# Patient Record
Sex: Female | Born: 1980 | Race: White | Hispanic: No | Marital: Married | Smoking: Former smoker
Health system: Southern US, Community
[De-identification: ages and names within clinical notes are randomized; demographics above are authoritative.]

## PROBLEM LIST (undated history)

## (undated) DIAGNOSIS — L639 Alopecia areata, unspecified: Secondary | ICD-10-CM

## (undated) DIAGNOSIS — K8020 Calculus of gallbladder without cholecystitis without obstruction: Secondary | ICD-10-CM

## (undated) DIAGNOSIS — K589 Irritable bowel syndrome without diarrhea: Secondary | ICD-10-CM

## (undated) DIAGNOSIS — J189 Pneumonia, unspecified organism: Secondary | ICD-10-CM

## (undated) HISTORY — PX: WISDOM TOOTH EXTRACTION: SHX21

---

## 2014-11-12 ENCOUNTER — Other Ambulatory Visit (HOSPITAL_COMMUNITY)
Admission: RE | Admit: 2014-11-12 | Discharge: 2014-11-12 | Disposition: A | Discharge status: Home / Self Care | Payer: BC Managed Care – PPO | Source: Ambulatory Visit | Attending: Family Medicine | Admitting: Family Medicine

## 2014-11-12 DIAGNOSIS — Z124 Encounter for screening for malignant neoplasm of cervix: Secondary | ICD-10-CM | POA: Insufficient documentation

## 2014-11-12 DIAGNOSIS — Z1151 Encounter for screening for human papillomavirus (HPV): Secondary | ICD-10-CM | POA: Diagnosis present

## 2016-04-12 DIAGNOSIS — L638 Other alopecia areata: Secondary | ICD-10-CM | POA: Diagnosis not present

## 2016-04-21 DIAGNOSIS — L298 Other pruritus: Secondary | ICD-10-CM | POA: Diagnosis not present

## 2016-04-21 DIAGNOSIS — B373 Candidiasis of vulva and vagina: Secondary | ICD-10-CM | POA: Diagnosis not present

## 2016-05-25 DIAGNOSIS — L638 Other alopecia areata: Secondary | ICD-10-CM | POA: Diagnosis not present

## 2016-06-10 DIAGNOSIS — E559 Vitamin D deficiency, unspecified: Secondary | ICD-10-CM | POA: Diagnosis not present

## 2016-06-17 DIAGNOSIS — L638 Other alopecia areata: Secondary | ICD-10-CM | POA: Diagnosis not present

## 2016-07-07 DIAGNOSIS — L638 Other alopecia areata: Secondary | ICD-10-CM | POA: Diagnosis not present

## 2016-08-19 DIAGNOSIS — L638 Other alopecia areata: Secondary | ICD-10-CM | POA: Diagnosis not present

## 2016-10-04 DIAGNOSIS — L638 Other alopecia areata: Secondary | ICD-10-CM | POA: Diagnosis not present

## 2016-10-04 DIAGNOSIS — Z79899 Other long term (current) drug therapy: Secondary | ICD-10-CM | POA: Diagnosis not present

## 2016-10-11 DIAGNOSIS — E7800 Pure hypercholesterolemia, unspecified: Secondary | ICD-10-CM | POA: Diagnosis not present

## 2016-10-11 DIAGNOSIS — E559 Vitamin D deficiency, unspecified: Secondary | ICD-10-CM | POA: Diagnosis not present

## 2016-10-11 DIAGNOSIS — Z0001 Encounter for general adult medical examination with abnormal findings: Secondary | ICD-10-CM | POA: Diagnosis not present

## 2016-10-20 DIAGNOSIS — Z79899 Other long term (current) drug therapy: Secondary | ICD-10-CM | POA: Diagnosis not present

## 2016-10-20 DIAGNOSIS — L638 Other alopecia areata: Secondary | ICD-10-CM | POA: Diagnosis not present

## 2016-11-16 DIAGNOSIS — L638 Other alopecia areata: Secondary | ICD-10-CM | POA: Diagnosis not present

## 2016-11-16 DIAGNOSIS — Z79899 Other long term (current) drug therapy: Secondary | ICD-10-CM | POA: Diagnosis not present

## 2016-12-23 DIAGNOSIS — Z79899 Other long term (current) drug therapy: Secondary | ICD-10-CM | POA: Diagnosis not present

## 2016-12-23 DIAGNOSIS — L638 Other alopecia areata: Secondary | ICD-10-CM | POA: Diagnosis not present

## 2017-01-25 DIAGNOSIS — L638 Other alopecia areata: Secondary | ICD-10-CM | POA: Diagnosis not present

## 2017-01-25 DIAGNOSIS — D692 Other nonthrombocytopenic purpura: Secondary | ICD-10-CM | POA: Diagnosis not present

## 2017-03-28 DIAGNOSIS — L638 Other alopecia areata: Secondary | ICD-10-CM | POA: Diagnosis not present

## 2017-03-28 DIAGNOSIS — Z79899 Other long term (current) drug therapy: Secondary | ICD-10-CM | POA: Diagnosis not present

## 2017-04-11 DIAGNOSIS — H5213 Myopia, bilateral: Secondary | ICD-10-CM | POA: Diagnosis not present

## 2017-04-11 DIAGNOSIS — Z79899 Other long term (current) drug therapy: Secondary | ICD-10-CM | POA: Diagnosis not present

## 2017-05-02 DIAGNOSIS — Z79899 Other long term (current) drug therapy: Secondary | ICD-10-CM | POA: Diagnosis not present

## 2017-06-27 DIAGNOSIS — L638 Other alopecia areata: Secondary | ICD-10-CM | POA: Diagnosis not present

## 2017-06-27 DIAGNOSIS — Z79899 Other long term (current) drug therapy: Secondary | ICD-10-CM | POA: Diagnosis not present

## 2017-06-27 DIAGNOSIS — L811 Chloasma: Secondary | ICD-10-CM | POA: Diagnosis not present

## 2017-08-14 DIAGNOSIS — N760 Acute vaginitis: Secondary | ICD-10-CM | POA: Diagnosis not present

## 2017-10-18 ENCOUNTER — Other Ambulatory Visit (HOSPITAL_COMMUNITY)
Admission: RE | Admit: 2017-10-18 | Discharge: 2017-10-18 | Disposition: A | Discharge status: Home / Self Care | Payer: BLUE CROSS/BLUE SHIELD | Source: Ambulatory Visit | Attending: Family Medicine | Admitting: Family Medicine

## 2017-10-18 ENCOUNTER — Other Ambulatory Visit: Payer: Self-pay | Admitting: Family Medicine

## 2017-10-18 DIAGNOSIS — Z0000 Encounter for general adult medical examination without abnormal findings: Secondary | ICD-10-CM | POA: Diagnosis not present

## 2017-10-18 DIAGNOSIS — Z124 Encounter for screening for malignant neoplasm of cervix: Secondary | ICD-10-CM | POA: Insufficient documentation

## 2017-10-18 DIAGNOSIS — L630 Alopecia (capitis) totalis: Secondary | ICD-10-CM | POA: Diagnosis not present

## 2017-10-19 LAB — CYTOLOGY - PAP
Diagnosis: NEGATIVE
HPV: NOT DETECTED

## 2017-11-03 DIAGNOSIS — L638 Other alopecia areata: Secondary | ICD-10-CM | POA: Diagnosis not present

## 2017-11-03 DIAGNOSIS — Z79899 Other long term (current) drug therapy: Secondary | ICD-10-CM | POA: Diagnosis not present

## 2018-03-02 DIAGNOSIS — Z79899 Other long term (current) drug therapy: Secondary | ICD-10-CM | POA: Diagnosis not present

## 2018-03-02 DIAGNOSIS — L638 Other alopecia areata: Secondary | ICD-10-CM | POA: Diagnosis not present

## 2018-04-20 DIAGNOSIS — Z79899 Other long term (current) drug therapy: Secondary | ICD-10-CM | POA: Diagnosis not present

## 2018-04-20 DIAGNOSIS — H5213 Myopia, bilateral: Secondary | ICD-10-CM | POA: Diagnosis not present

## 2018-07-06 DIAGNOSIS — Z79899 Other long term (current) drug therapy: Secondary | ICD-10-CM | POA: Diagnosis not present

## 2018-07-06 DIAGNOSIS — L638 Other alopecia areata: Secondary | ICD-10-CM | POA: Diagnosis not present

## 2018-10-07 DIAGNOSIS — N760 Acute vaginitis: Secondary | ICD-10-CM | POA: Diagnosis not present

## 2018-10-21 DIAGNOSIS — B373 Candidiasis of vulva and vagina: Secondary | ICD-10-CM | POA: Diagnosis not present

## 2018-11-07 DIAGNOSIS — Z131 Encounter for screening for diabetes mellitus: Secondary | ICD-10-CM | POA: Diagnosis not present

## 2018-11-07 DIAGNOSIS — Z0000 Encounter for general adult medical examination without abnormal findings: Secondary | ICD-10-CM | POA: Diagnosis not present

## 2018-11-07 DIAGNOSIS — Z13220 Encounter for screening for lipoid disorders: Secondary | ICD-10-CM | POA: Diagnosis not present

## 2018-12-13 DIAGNOSIS — Z79899 Other long term (current) drug therapy: Secondary | ICD-10-CM | POA: Diagnosis not present

## 2018-12-13 DIAGNOSIS — L638 Other alopecia areata: Secondary | ICD-10-CM | POA: Diagnosis not present

## 2019-03-07 DIAGNOSIS — L638 Other alopecia areata: Secondary | ICD-10-CM | POA: Diagnosis not present

## 2019-07-15 DIAGNOSIS — N898 Other specified noninflammatory disorders of vagina: Secondary | ICD-10-CM | POA: Diagnosis not present

## 2019-11-03 DIAGNOSIS — R1011 Right upper quadrant pain: Secondary | ICD-10-CM | POA: Diagnosis not present

## 2019-11-03 DIAGNOSIS — Z79899 Other long term (current) drug therapy: Secondary | ICD-10-CM | POA: Diagnosis not present

## 2019-11-03 DIAGNOSIS — F17200 Nicotine dependence, unspecified, uncomplicated: Secondary | ICD-10-CM | POA: Diagnosis not present

## 2019-11-06 DIAGNOSIS — R1011 Right upper quadrant pain: Secondary | ICD-10-CM | POA: Diagnosis not present

## 2019-11-06 DIAGNOSIS — Z23 Encounter for immunization: Secondary | ICD-10-CM | POA: Diagnosis not present

## 2019-11-09 ENCOUNTER — Other Ambulatory Visit: Payer: Self-pay | Admitting: Family Medicine

## 2019-11-09 DIAGNOSIS — R1011 Right upper quadrant pain: Secondary | ICD-10-CM

## 2019-11-13 ENCOUNTER — Ambulatory Visit
Admission: RE | Admit: 2019-11-13 | Discharge: 2019-11-13 | Disposition: A | Discharge status: Home / Self Care | Payer: BC Managed Care – PPO | Source: Ambulatory Visit | Attending: Family Medicine | Admitting: Family Medicine

## 2019-11-13 DIAGNOSIS — K8020 Calculus of gallbladder without cholecystitis without obstruction: Secondary | ICD-10-CM | POA: Diagnosis not present

## 2019-11-13 DIAGNOSIS — R1011 Right upper quadrant pain: Secondary | ICD-10-CM

## 2019-11-16 ENCOUNTER — Ambulatory Visit: Payer: Self-pay | Admitting: Surgery

## 2019-11-16 DIAGNOSIS — K8010 Calculus of gallbladder with chronic cholecystitis without obstruction: Secondary | ICD-10-CM | POA: Diagnosis not present

## 2019-11-16 NOTE — H&P (Signed)
History of Present Illness Wilmon Arms. Brailey Buescher MD; 11/16/2019 12:09 PM) The patient is a 38 year old female who presents for evaluation of gall stones. CLINICAL DATA: Right upper quadrant pain Referred By Dr. Beverley Fiedler for gallstones  This is a 38 year old female with a history of IBS who presents with a three-week history of postprandial upper abdominal pain radiating through to her back. This is associate with nausea and vomiting. Her symptoms have become more severe where she has symptoms almost every time she tries to eat. She has been existing on crackers and sliced [place] and hot tea for the last several days. She denies any significant diarrhea or abdominal bloating. This morning she had a banana and has been asymptomatic. She is referred to [place] for evaluation after recent ultrasound. She did have a visit to an outside ER early in November that showed normal liver function tests and a normal white blood cell count.   EXAM: ULTRASOUND ABDOMEN LIMITED RIGHT UPPER QUADRANT  COMPARISON: None.  FINDINGS: Gallbladder:  Non mobile shadowing stone at the gallbladder neck. Normal wall thickness. Positive sonographic Murphy. Additional nonshadowing focus at the gallbladder fundus either representing tumefactive sludge or nonshadowing stone. Focus of comet tail artifact arising from the anterior gallbladder wall.  Common bile duct:  Diameter: 2 mm  Liver:  No focal lesion identified. Within normal limits in parenchymal echogenicity. Portal vein is patent on color Doppler imaging with normal direction of blood flow towards the liver.  Other: None.  IMPRESSION: 1. Non mobile stone at the gallbladder neck with positive sonographic Eulah Pont, raises some concern for an acute cholecystitis. Wall thickness is normal and there is no pericholecystic fluid. Consider correlation with nuclear medicine hepatobiliary imaging 2. No biliary dilatation 3. 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   Electronically Signed By: Jasmine Pang M.D. On: 11/13/2019 15:18   Past Surgical History Santiago Glad, [place]; 11/16/2019 11:24 AM) Oral Surgery  Diagnostic Studies History Santiago Glad, [place]; 11/16/2019 11:24 AM) Colonoscopy never Mammogram never Pap Smear 1-5 years ago  Allergies Santiago Glad, CMA; 11/16/2019 11:26 AM) No Known Drug Allergies [11/16/2019]: Allergies Reconciled  Medication History Santiago Glad, CMA; 11/16/2019 11:26 AM) Ezetimibe-Simvastatin (10-40MG  Tablet, Oral) Active. traMADol HCl (50MG  Tablet, Oral) Active. Hydroxychloroquine Sulfate (200MG  Tablet, Oral) Active. Medications Reconciled  Social History , ; 11/16/2019 11:24 AM) Alcohol use Occasional alcohol use. Caffeine use Coffee, Tea. No drug use Tobacco use Current every day smoker.  Family History [place], 11/18/2019; 11/16/2019 11:24 AM) Arthritis Family Members In General. Migraine Headache Father. Thyroid problems Mother.  Pregnancy / Birth History [place], 11/18/2019; 11/16/2019 11:24 AM) Age at menarche 12 years. Contraceptive History Depo-provera, Oral contraceptives. Gravida 0 Para 0 Regular periods  Other Problems [place], CMA; 11/16/2019 11:24 AM) Cholelithiasis Gastroesophageal Reflux Disease Other disease, cancer, significant illness     Review of Systems Santiago Glad CMA; 11/16/2019 11:24 AM) General Present- Weight Loss. Not Present- Appetite Loss, Chills, Fatigue, Fever, Night Sweats and Weight Gain. Skin Not Present- Change in Wart/Mole, Dryness, Hives, Jaundice, New Lesions, Non-Healing Wounds, Rash and Ulcer. HEENT Present- Wears glasses/contact lenses. Not Present- Earache, Hearing Loss, Hoarseness, Nose  Bleed, Oral Ulcers, Ringing in the Ears, Seasonal Allergies, Sinus Pain, Sore Throat, Visual Disturbances and Yellow Eyes. Respiratory Not Present- Bloody sputum, Chronic Cough, Difficulty Breathing, Snoring and Wheezing. Breast Not Present- Breast Mass,  Breast Pain, Nipple Discharge and Skin Changes. Cardiovascular Present- Chest Pain. Not Present- Difficulty Breathing Lying Down, Leg Cramps, Palpitations, Rapid Heart Rate, Shortness of Breath and Swelling of Extremities. Gastrointestinal Present- Abdominal Pain, Bloating, Change in Bowel Habits, Indigestion and Nausea. Not Present- Bloody Stool, Chronic diarrhea, Constipation, Difficulty Swallowing, Excessive gas, Gets full quickly at meals, Hemorrhoids, Rectal Pain and Vomiting. Female Genitourinary Not Present- Frequency, Nocturia, Painful Urination, Pelvic Pain and Urgency. Musculoskeletal Not Present- Back Pain, Joint Pain, Joint Stiffness, Muscle Pain, Muscle Weakness and Swelling of Extremities. Neurological Not Present- Decreased Memory, Fainting, Headaches, Numbness, Seizures, Tingling, Tremor, Trouble walking and Weakness. Psychiatric Present- Anxiety. Not Present- Bipolar, Change in Sleep Pattern, Depression, Fearful and Frequent crying. Endocrine Not Present- Cold Intolerance, Excessive Hunger, Hair Changes, Heat Intolerance, Hot flashes and New Diabetes. Hematology Not Present- Blood Thinners, Easy Bruising, Excessive bleeding, Gland problems, HIV and Persistent Infections.  Vitals Emeline Gins CMA; 11/16/2019 11:25 AM) 11/16/2019 11:24 AM Weight: 184.8 lb Height: 66in Body Surface Area: 1.93 m Body Mass Index: 29.83 kg/m  Temp.: 98.39F  Pulse: 86 (Regular)  BP: 100/68 (Sitting, Left Arm, Standard)        Physical Exam Rodman Key K. Dacotah Cabello MD; 11/16/2019 12:10 PM)  The physical exam findings are as follows: Note:WDWN in NAD Eyes: Pupils equal, round; sclera anicteric HENT: Oral mucosa moist; good  dentition Neck: No masses palpated, no thyromegaly Lungs: CTA bilaterally; normal respiratory effort CV: Regular rate and rhythm; no murmurs; extremities well-perfused with no edema Abd: +bowel sounds, soft, mildly tender in RUQ, no palpable organomegaly; no palpable hernias Skin: Warm, dry; no sign of jaundice Psychiatric - alert and oriented x 4; calm mood and affect    Assessment & Plan Rodman Key K. Tahnee Cifuentes MD; 11/16/2019 12:03 PM)  CHRONIC CHOLECYSTITIS WITH CALCULUS (K80.10)  Current Plans Schedule for Surgery - Laparoscopic cholecystectomy with intraoperative cholangiogram. The surgical procedure has been discussed with the patient. Potential risks, benefits, alternative treatments, and expected outcomes have been explained. All of the patient's questions at this time have been answered. The likelihood of reaching the patient's treatment goal is good. The patient understand the proposed surgical procedure and wishes to proceed. Started oxyCODONE HCl 5 MG Oral Tablet, 1 (one) Tablet every four hours, as needed, #20, 11/16/2019, No Refill.  Imogene Burn. Georgette Dover, MD, [facility]  General/ Trauma Surgery   11/16/2019 12:10 PM

## 2019-11-16 NOTE — H&P (View-Only) (Signed)
History of Present Illness Wilmon Arms. Tavaras Goody MD; 11/16/2019 12:09 PM) The patient is a 38 year old female who presents for evaluation of gall stones. CLINICAL DATA: Right upper quadrant pain Referred By Dr. Beverley Fiedler for gallstones  This is a 38 year old female with a history of IBS who presents with a three-week history of postprandial upper abdominal pain radiating through to her back. This is associate with nausea and vomiting. Her symptoms have become more severe where she has symptoms almost every time she tries to eat. She has been existing on crackers and sliced [place] and hot tea for the last several days. She denies any significant diarrhea or abdominal bloating. This morning she had a banana and has been asymptomatic. She is referred to [place] for evaluation after recent ultrasound. She did have a visit to an outside ER early in November that showed normal liver function tests and a normal white blood cell count.   EXAM: ULTRASOUND ABDOMEN LIMITED RIGHT UPPER QUADRANT  COMPARISON: None.  FINDINGS: Gallbladder:  Non mobile shadowing stone at the gallbladder neck. Normal wall thickness. Positive sonographic Murphy. Additional nonshadowing focus at the gallbladder fundus either representing tumefactive sludge or nonshadowing stone. Focus of comet tail artifact arising from the anterior gallbladder wall.  Common bile duct:  Diameter: 2 mm  Liver:  No focal lesion identified. Within normal limits in parenchymal echogenicity. Portal vein is patent on color Doppler imaging with normal direction of blood flow towards the liver.  Other: None.  IMPRESSION: 1. Non mobile stone at the gallbladder neck with positive sonographic Eulah Pont, raises some concern for an acute cholecystitis. Wall thickness is normal and there is no pericholecystic fluid. Consider correlation with nuclear medicine hepatobiliary imaging 2. No biliary dilatation 3. 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   Electronically Signed By: Jasmine Pang M.D. On: 11/13/2019 15:18   Past Surgical History Santiago Glad, [place]; 11/16/2019 11:24 AM) Oral Surgery  Diagnostic Studies History Santiago Glad, [place]; 11/16/2019 11:24 AM) Colonoscopy never Mammogram never Pap Smear 1-5 years ago  Allergies Santiago Glad, CMA; 11/16/2019 11:26 AM) No Known Drug Allergies [11/16/2019]: Allergies Reconciled  Medication History Santiago Glad, CMA; 11/16/2019 11:26 AM) Ezetimibe-Simvastatin (10-40MG  Tablet, Oral) Active. traMADol HCl (50MG  Tablet, Oral) Active. Hydroxychloroquine Sulfate (200MG  Tablet, Oral) Active. Medications Reconciled  Social History , ; 11/16/2019 11:24 AM) Alcohol use Occasional alcohol use. Caffeine use Coffee, Tea. No drug use Tobacco use Current every day smoker.  Family History [place], 11/18/2019; 11/16/2019 11:24 AM) Arthritis Family Members In General. Migraine Headache Father. Thyroid problems Mother.  Pregnancy / Birth History [place], 11/18/2019; 11/16/2019 11:24 AM) Age at menarche 12 years. Contraceptive History Depo-provera, Oral contraceptives. Gravida 0 Para 0 Regular periods  Other Problems [place], CMA; 11/16/2019 11:24 AM) Cholelithiasis Gastroesophageal Reflux Disease Other disease, cancer, significant illness     Review of Systems Santiago Glad CMA; 11/16/2019 11:24 AM) General Present- Weight Loss. Not Present- Appetite Loss, Chills, Fatigue, Fever, Night Sweats and Weight Gain. Skin Not Present- Change in Wart/Mole, Dryness, Hives, Jaundice, New Lesions, Non-Healing Wounds, Rash and Ulcer. HEENT Present- Wears glasses/contact lenses. Not Present- Earache, Hearing Loss, Hoarseness, Nose  Bleed, Oral Ulcers, Ringing in the Ears, Seasonal Allergies, Sinus Pain, Sore Throat, Visual Disturbances and Yellow Eyes. Respiratory Not Present- Bloody sputum, Chronic Cough, Difficulty Breathing, Snoring and Wheezing. Breast Not Present- Breast Mass,  Breast Pain, Nipple Discharge and Skin Changes. Cardiovascular Present- Chest Pain. Not Present- Difficulty Breathing Lying Down, Leg Cramps, Palpitations, Rapid Heart Rate, Shortness of Breath and Swelling of Extremities. Gastrointestinal Present- Abdominal Pain, Bloating, Change in Bowel Habits, Indigestion and Nausea. Not Present- Bloody Stool, Chronic diarrhea, Constipation, Difficulty Swallowing, Excessive gas, Gets full quickly at meals, Hemorrhoids, Rectal Pain and Vomiting. Female Genitourinary Not Present- Frequency, Nocturia, Painful Urination, Pelvic Pain and Urgency. Musculoskeletal Not Present- Back Pain, Joint Pain, Joint Stiffness, Muscle Pain, Muscle Weakness and Swelling of Extremities. Neurological Not Present- Decreased Memory, Fainting, Headaches, Numbness, Seizures, Tingling, Tremor, Trouble walking and Weakness. Psychiatric Present- Anxiety. Not Present- Bipolar, Change in Sleep Pattern, Depression, Fearful and Frequent crying. Endocrine Not Present- Cold Intolerance, Excessive Hunger, Hair Changes, Heat Intolerance, Hot flashes and New Diabetes. Hematology Not Present- Blood Thinners, Easy Bruising, Excessive bleeding, Gland problems, HIV and Persistent Infections.  Vitals Emeline Gins CMA; 11/16/2019 11:25 AM) 11/16/2019 11:24 AM Weight: 184.8 lb Height: 66in Body Surface Area: 1.93 m Body Mass Index: 29.83 kg/m  Temp.: 98.39F  Pulse: 86 (Regular)  BP: 100/68 (Sitting, Left Arm, Standard)        Physical Exam Rodman Key K. Orlondo Holycross MD; 11/16/2019 12:10 PM)  The physical exam findings are as follows: Note:WDWN in NAD Eyes: Pupils equal, round; sclera anicteric HENT: Oral mucosa moist; good  dentition Neck: No masses palpated, no thyromegaly Lungs: CTA bilaterally; normal respiratory effort CV: Regular rate and rhythm; no murmurs; extremities well-perfused with no edema Abd: +bowel sounds, soft, mildly tender in RUQ, no palpable organomegaly; no palpable hernias Skin: Warm, dry; no sign of jaundice Psychiatric - alert and oriented x 4; calm mood and affect    Assessment & Plan Rodman Key K. Chriss Mannan MD; 11/16/2019 12:03 PM)  CHRONIC CHOLECYSTITIS WITH CALCULUS (K80.10)  Current Plans Schedule for Surgery - Laparoscopic cholecystectomy with intraoperative cholangiogram. The surgical procedure has been discussed with the patient. Potential risks, benefits, alternative treatments, and expected outcomes have been explained. All of the patient's questions at this time have been answered. The likelihood of reaching the patient's treatment goal is good. The patient understand the proposed surgical procedure and wishes to proceed. Started oxyCODONE HCl 5 MG Oral Tablet, 1 (one) Tablet every four hours, as needed, #20, 11/16/2019, No Refill.  Imogene Burn. Georgette Dover, MD, [facility]  General/ Trauma Surgery   11/16/2019 12:10 PM

## 2019-11-19 ENCOUNTER — Other Ambulatory Visit: Payer: Self-pay

## 2019-11-19 ENCOUNTER — Encounter (HOSPITAL_COMMUNITY): Payer: Self-pay | Admitting: *Deleted

## 2019-11-19 NOTE — Progress Notes (Signed)
Spoke with pt for pre-op call. Pt denies cardiac history, HTN or Diabetes.   Pt will get her Covid test done tomorrow, 11/20/19. Quarantine instructions given to pt and she voiced understanding.

## 2019-11-20 ENCOUNTER — Other Ambulatory Visit (HOSPITAL_COMMUNITY)
Admission: RE | Admit: 2019-11-20 | Discharge: 2019-11-20 | Disposition: A | Discharge status: Home / Self Care | Payer: BC Managed Care – PPO | Source: Ambulatory Visit | Attending: Surgery | Admitting: Surgery

## 2019-11-20 DIAGNOSIS — Z20828 Contact with and (suspected) exposure to other viral communicable diseases: Secondary | ICD-10-CM | POA: Diagnosis not present

## 2019-11-20 DIAGNOSIS — Z01812 Encounter for preprocedural laboratory examination: Secondary | ICD-10-CM | POA: Diagnosis not present

## 2019-11-20 LAB — SARS CORONAVIRUS 2 (TAT 6-24 HRS): SARS Coronavirus 2: NEGATIVE

## 2019-11-23 ENCOUNTER — Other Ambulatory Visit: Payer: Self-pay

## 2019-11-23 ENCOUNTER — Ambulatory Visit (HOSPITAL_COMMUNITY)
Admission: RE | Admit: 2019-11-23 | Discharge: 2019-11-23 | Disposition: A | Discharge status: Home / Self Care | Payer: BC Managed Care – PPO | Attending: Surgery | Admitting: Surgery

## 2019-11-23 ENCOUNTER — Ambulatory Visit (HOSPITAL_COMMUNITY): Payer: BC Managed Care – PPO | Admitting: Anesthesiology

## 2019-11-23 ENCOUNTER — Ambulatory Visit (HOSPITAL_COMMUNITY): Payer: BC Managed Care – PPO

## 2019-11-23 ENCOUNTER — Encounter (HOSPITAL_COMMUNITY)
Admission: RE | Disposition: A | Discharge status: Home / Self Care | Payer: Self-pay | Source: Home / Self Care | Attending: Surgery

## 2019-11-23 ENCOUNTER — Encounter (HOSPITAL_COMMUNITY): Payer: Self-pay

## 2019-11-23 DIAGNOSIS — L639 Alopecia areata, unspecified: Secondary | ICD-10-CM | POA: Diagnosis not present

## 2019-11-23 DIAGNOSIS — Z419 Encounter for procedure for purposes other than remedying health state, unspecified: Secondary | ICD-10-CM

## 2019-11-23 DIAGNOSIS — Z79891 Long term (current) use of opiate analgesic: Secondary | ICD-10-CM | POA: Diagnosis not present

## 2019-11-23 DIAGNOSIS — Z79899 Other long term (current) drug therapy: Secondary | ICD-10-CM | POA: Insufficient documentation

## 2019-11-23 DIAGNOSIS — K8012 Calculus of gallbladder with acute and chronic cholecystitis without obstruction: Secondary | ICD-10-CM | POA: Insufficient documentation

## 2019-11-23 DIAGNOSIS — K8010 Calculus of gallbladder with chronic cholecystitis without obstruction: Secondary | ICD-10-CM | POA: Diagnosis not present

## 2019-11-23 DIAGNOSIS — F17200 Nicotine dependence, unspecified, uncomplicated: Secondary | ICD-10-CM | POA: Diagnosis not present

## 2019-11-23 DIAGNOSIS — J189 Pneumonia, unspecified organism: Secondary | ICD-10-CM | POA: Diagnosis not present

## 2019-11-23 DIAGNOSIS — K8000 Calculus of gallbladder with acute cholecystitis without obstruction: Secondary | ICD-10-CM | POA: Diagnosis not present

## 2019-11-23 DIAGNOSIS — K8020 Calculus of gallbladder without cholecystitis without obstruction: Secondary | ICD-10-CM | POA: Diagnosis not present

## 2019-11-23 HISTORY — DX: Pneumonia, unspecified organism: J18.9

## 2019-11-23 HISTORY — DX: Irritable bowel syndrome without diarrhea: K58.9

## 2019-11-23 HISTORY — PX: CHOLECYSTECTOMY: SHX55

## 2019-11-23 HISTORY — DX: Alopecia areata, unspecified: L63.9

## 2019-11-23 LAB — CBC
HCT: 39.8 % (ref 36.0–46.0)
Hemoglobin: 13.8 g/dL (ref 12.0–15.0)
MCH: 32.1 pg (ref 26.0–34.0)
MCHC: 34.7 g/dL (ref 30.0–36.0)
MCV: 92.6 fL (ref 80.0–100.0)
Platelets: 245 10*3/uL (ref 150–400)
RBC: 4.3 MIL/uL (ref 3.87–5.11)
RDW: 12 % (ref 11.5–15.5)
WBC: 9.4 10*3/uL (ref 4.0–10.5)
nRBC: 0 % (ref 0.0–0.2)

## 2019-11-23 LAB — POCT PREGNANCY, URINE: Preg Test, Ur: NEGATIVE

## 2019-11-23 SURGERY — LAPAROSCOPIC CHOLECYSTECTOMY WITH INTRAOPERATIVE CHOLANGIOGRAM
Anesthesia: General

## 2019-11-23 MED ORDER — ACETAMINOPHEN 10 MG/ML IV SOLN: 1000.0000 mg | Freq: Once | INTRAVENOUS | Status: DC | PRN

## 2019-11-23 MED ORDER — SODIUM CHLORIDE 0.9 % IV SOLN: INTRAVENOUS | Status: DC | PRN

## 2019-11-23 MED ORDER — SUGAMMADEX SODIUM 200 MG/2ML IV SOLN
INTRAVENOUS | Status: DC | PRN
  Administered 2019-11-23: 200 mg via INTRAVENOUS

## 2019-11-23 MED ORDER — ACETAMINOPHEN 500 MG PO TABS
1000.0000 mg | ORAL_TABLET | ORAL | Status: AC
  Administered 2019-11-23: 1000 mg via ORAL
  Filled 2019-11-23: qty 2

## 2019-11-23 MED ORDER — ONDANSETRON HCL 4 MG/2ML IJ SOLN
INTRAMUSCULAR | Status: DC | PRN
  Administered 2019-11-23: 4 mg via INTRAVENOUS

## 2019-11-23 MED ORDER — FENTANYL CITRATE (PF) 100 MCG/2ML IJ SOLN: 25.0000 ug | INTRAMUSCULAR | Status: DC | PRN

## 2019-11-23 MED ORDER — ROCURONIUM BROMIDE 10 MG/ML (PF) SYRINGE
PREFILLED_SYRINGE | INTRAVENOUS | Status: AC
  Filled 2019-11-23: qty 10

## 2019-11-23 MED ORDER — STERILE WATER FOR IRRIGATION IR SOLN
Status: DC | PRN
  Administered 2019-11-23: 1000 mL

## 2019-11-23 MED ORDER — 0.9 % SODIUM CHLORIDE (POUR BTL) OPTIME
TOPICAL | Status: DC | PRN
  Administered 2019-11-23: 1000 mL

## 2019-11-23 MED ORDER — LACTATED RINGERS IV SOLN: INTRAVENOUS | Status: DC

## 2019-11-23 MED ORDER — LIDOCAINE 2% (20 MG/ML) 5 ML SYRINGE
INTRAMUSCULAR | Status: DC | PRN
  Administered 2019-11-23: 60 mg via INTRAVENOUS

## 2019-11-23 MED ORDER — SODIUM CHLORIDE 0.9 % IV SOLN
INTRAVENOUS | Status: DC | PRN
  Administered 2019-11-23: 8 mL

## 2019-11-23 MED ORDER — ACETAMINOPHEN 325 MG PO TABS: 325.0000 mg | ORAL_TABLET | Freq: Once | ORAL | Status: DC | PRN

## 2019-11-23 MED ORDER — SODIUM CHLORIDE 0.9 % IR SOLN
Status: DC | PRN
  Administered 2019-11-23: 1000 mL

## 2019-11-23 MED ORDER — FENTANYL CITRATE (PF) 250 MCG/5ML IJ SOLN
INTRAMUSCULAR | Status: DC | PRN
  Administered 2019-11-23 (×2): 50 ug via INTRAVENOUS
  Administered 2019-11-23: 100 ug via INTRAVENOUS
  Administered 2019-11-23 (×4): 50 ug via INTRAVENOUS

## 2019-11-23 MED ORDER — ONDANSETRON HCL 4 MG/2ML IJ SOLN
INTRAMUSCULAR | Status: AC
  Filled 2019-11-23: qty 2

## 2019-11-23 MED ORDER — OXYCODONE HCL 5 MG PO TABS: 5.0000 mg | ORAL_TABLET | Freq: Four times a day (QID) | ORAL | 0 refills | Status: DC | PRN

## 2019-11-23 MED ORDER — DEXAMETHASONE SODIUM PHOSPHATE 10 MG/ML IJ SOLN
INTRAMUSCULAR | Status: DC | PRN
  Administered 2019-11-23: 5 mg via INTRAVENOUS

## 2019-11-23 MED ORDER — ACETAMINOPHEN 160 MG/5ML PO SOLN: 325.0000 mg | Freq: Once | ORAL | Status: DC | PRN

## 2019-11-23 MED ORDER — KETOROLAC TROMETHAMINE 30 MG/ML IJ SOLN
INTRAMUSCULAR | Status: DC | PRN
  Administered 2019-11-23: 30 mg via INTRAVENOUS

## 2019-11-23 MED ORDER — FENTANYL CITRATE (PF) 250 MCG/5ML IJ SOLN
INTRAMUSCULAR | Status: AC
  Filled 2019-11-23: qty 5

## 2019-11-23 MED ORDER — LIDOCAINE 2% (20 MG/ML) 5 ML SYRINGE
INTRAMUSCULAR | Status: AC
  Filled 2019-11-23: qty 5

## 2019-11-23 MED ORDER — CEFAZOLIN SODIUM-DEXTROSE 2-4 GM/100ML-% IV SOLN
2.0000 g | INTRAVENOUS | Status: AC
  Administered 2019-11-23: 2 g via INTRAVENOUS
  Filled 2019-11-23: qty 100

## 2019-11-23 MED ORDER — PROPOFOL 10 MG/ML IV BOLUS
INTRAVENOUS | Status: AC
  Filled 2019-11-23: qty 20

## 2019-11-23 MED ORDER — BUPIVACAINE-EPINEPHRINE (PF) 0.5% -1:200000 IJ SOLN
INTRAMUSCULAR | Status: DC | PRN
  Administered 2019-11-23: 14 mL via PERINEURAL

## 2019-11-23 MED ORDER — PROMETHAZINE HCL 25 MG/ML IJ SOLN: 6.2500 mg | INTRAMUSCULAR | Status: DC | PRN

## 2019-11-23 MED ORDER — ROCURONIUM BROMIDE 10 MG/ML (PF) SYRINGE
PREFILLED_SYRINGE | INTRAVENOUS | Status: DC | PRN
  Administered 2019-11-23: 60 mg via INTRAVENOUS
  Administered 2019-11-23: 10 mg via INTRAVENOUS

## 2019-11-23 MED ORDER — CHLORHEXIDINE GLUCONATE CLOTH 2 % EX PADS: 6.0000 | MEDICATED_PAD | Freq: Once | CUTANEOUS | Status: DC

## 2019-11-23 MED ORDER — BUPIVACAINE HCL (PF) 0.25 % IJ SOLN
INTRAMUSCULAR | Status: AC
  Filled 2019-11-23: qty 30

## 2019-11-23 MED ORDER — MEPERIDINE HCL 25 MG/ML IJ SOLN: 6.2500 mg | INTRAMUSCULAR | Status: DC | PRN

## 2019-11-23 MED ORDER — GABAPENTIN 300 MG PO CAPS
300.0000 mg | ORAL_CAPSULE | ORAL | Status: AC
  Administered 2019-11-23: 300 mg via ORAL
  Filled 2019-11-23: qty 1

## 2019-11-23 MED ORDER — LACTATED RINGERS IV SOLN
INTRAVENOUS | Status: DC | PRN
  Administered 2019-11-23 (×2): via INTRAVENOUS

## 2019-11-23 MED ORDER — KETOROLAC TROMETHAMINE 30 MG/ML IJ SOLN
INTRAMUSCULAR | Status: AC
  Filled 2019-11-23: qty 1

## 2019-11-23 MED ORDER — BUPIVACAINE-EPINEPHRINE 0.5% -1:200000 IJ SOLN
INTRAMUSCULAR | Status: AC
  Filled 2019-11-23: qty 1

## 2019-11-23 MED ORDER — FENTANYL CITRATE (PF) 100 MCG/2ML IJ SOLN
INTRAMUSCULAR | Status: AC
  Filled 2019-11-23: qty 2

## 2019-11-23 MED ORDER — PROPOFOL 10 MG/ML IV BOLUS
INTRAVENOUS | Status: DC | PRN
  Administered 2019-11-23: 140 mg via INTRAVENOUS

## 2019-11-23 MED ORDER — DEXAMETHASONE SODIUM PHOSPHATE 10 MG/ML IJ SOLN
INTRAMUSCULAR | Status: AC
  Filled 2019-11-23: qty 1

## 2019-11-23 MED ORDER — MIDAZOLAM HCL 2 MG/2ML IJ SOLN
INTRAMUSCULAR | Status: AC
  Filled 2019-11-23: qty 2

## 2019-11-23 SURGICAL SUPPLY — 45 items
APPLIER CLIP ROT 10 11.4 M/L (STAPLE) ×2
BENZOIN TINCTURE PRP APPL 2/3 (GAUZE/BANDAGES/DRESSINGS) ×2 IMPLANT
BLADE CLIPPER SURG (BLADE) IMPLANT
CANISTER SUCT 3000ML PPV (MISCELLANEOUS) ×2 IMPLANT
CHLORAPREP W/TINT 26 (MISCELLANEOUS) ×2 IMPLANT
CLIP APPLIE ROT 10 11.4 M/L (STAPLE) ×1 IMPLANT
CLSR STERI-STRIP ANTIMIC 1/2X4 (GAUZE/BANDAGES/DRESSINGS) ×2 IMPLANT
COVER MAYO STAND STRL (DRAPES) ×2 IMPLANT
COVER SURGICAL LIGHT HANDLE (MISCELLANEOUS) ×2 IMPLANT
COVER WAND RF STERILE (DRAPES) IMPLANT
DRAPE C-ARM 42X120 X-RAY (DRAPES) ×2 IMPLANT
DRSG TEGADERM 2-3/8X2-3/4 SM (GAUZE/BANDAGES/DRESSINGS) ×6 IMPLANT
DRSG TEGADERM 4X4.75 (GAUZE/BANDAGES/DRESSINGS) ×2 IMPLANT
ELECT REM PT RETURN 9FT ADLT (ELECTROSURGICAL) ×2
ELECTRODE REM PT RTRN 9FT ADLT (ELECTROSURGICAL) ×1 IMPLANT
GAUZE SPONGE 2X2 8PLY STRL LF (GAUZE/BANDAGES/DRESSINGS) ×1 IMPLANT
GLOVE BIO SURGEON STRL SZ7 (GLOVE) ×2 IMPLANT
GLOVE BIOGEL PI IND STRL 7.5 (GLOVE) ×1 IMPLANT
GLOVE BIOGEL PI INDICATOR 7.5 (GLOVE) ×1
GOWN STRL REUS W/ TWL LRG LVL3 (GOWN DISPOSABLE) ×3 IMPLANT
GOWN STRL REUS W/TWL LRG LVL3 (GOWN DISPOSABLE) ×3
KIT BASIN OR (CUSTOM PROCEDURE TRAY) ×2 IMPLANT
KIT TURNOVER KIT B (KITS) ×2 IMPLANT
NS IRRIG 1000ML POUR BTL (IV SOLUTION) ×2 IMPLANT
PAD ARMBOARD 7.5X6 YLW CONV (MISCELLANEOUS) ×2 IMPLANT
POUCH RETRIEVAL ECOSAC 10 (ENDOMECHANICALS) ×1 IMPLANT
POUCH RETRIEVAL ECOSAC 10MM (ENDOMECHANICALS) ×1
POUCH SPECIMEN RETRIEVAL 10MM (ENDOMECHANICALS) IMPLANT
SCISSORS LAP 5X35 DISP (ENDOMECHANICALS) ×2 IMPLANT
SET CHOLANGIOGRAPH 5 50 .035 (SET/KITS/TRAYS/PACK) ×2 IMPLANT
SET IRRIG TUBING LAPAROSCOPIC (IRRIGATION / IRRIGATOR) ×2 IMPLANT
SET TUBE SMOKE EVAC HIGH FLOW (TUBING) ×2 IMPLANT
SLEEVE ENDOPATH XCEL 5M (ENDOMECHANICALS) ×2 IMPLANT
SPECIMEN JAR SMALL (MISCELLANEOUS) ×2 IMPLANT
SPONGE GAUZE 2X2 STER 10/PKG (GAUZE/BANDAGES/DRESSINGS) ×1
STRIP CLOSURE SKIN 1/2X4 (GAUZE/BANDAGES/DRESSINGS) ×2 IMPLANT
SUT MNCRL AB 4-0 PS2 18 (SUTURE) ×2 IMPLANT
SUT VICRYL 0 UR6 27IN ABS (SUTURE) ×4 IMPLANT
TOWEL GREEN STERILE (TOWEL DISPOSABLE) IMPLANT
TOWEL GREEN STERILE FF (TOWEL DISPOSABLE) ×2 IMPLANT
TRAY LAPAROSCOPIC MC (CUSTOM PROCEDURE TRAY) ×2 IMPLANT
TROCAR XCEL BLUNT TIP 100MML (ENDOMECHANICALS) ×2 IMPLANT
TROCAR XCEL NON-BLD 11X100MML (ENDOMECHANICALS) ×2 IMPLANT
TROCAR XCEL NON-BLD 5MMX100MML (ENDOMECHANICALS) ×2 IMPLANT
WATER STERILE IRR 1000ML POUR (IV SOLUTION) ×2 IMPLANT

## 2019-11-23 NOTE — Anesthesia Postprocedure Evaluation (Signed)
Anesthesia Post Note  Patient: Michelle Davies  Procedure(s) Performed: LAPAROSCOPIC CHOLECYSTECTOMY WITH INTRAOPERATIVE CHOLANGIOGRAM (N/A )     Patient location during evaluation: PACU Anesthesia Type: General Level of consciousness: awake and alert Pain management: pain level controlled Vital Signs Assessment: post-procedure vital signs reviewed and stable Respiratory status: spontaneous breathing, nonlabored ventilation, respiratory function stable and patient connected to nasal cannula oxygen Cardiovascular status: blood pressure returned to baseline and stable Postop Assessment: no apparent nausea or vomiting Anesthetic complications: no    Last Vitals:  Vitals:   11/23/19 0918 11/23/19 0925  BP: 116/75 120/74  Pulse: 73 74  Resp: 14 16  Temp: (!) 36.1 C   SpO2: 98% 99%    Last Pain:  Vitals:   11/23/19 0925  PainSc: 2                  Effie Berkshire

## 2019-11-23 NOTE — Transfer of Care (Signed)
Immediate Anesthesia Transfer of Care Note  Patient: Michelle Davies  Procedure(s) Performed: LAPAROSCOPIC CHOLECYSTECTOMY WITH INTRAOPERATIVE CHOLANGIOGRAM (N/A )  Patient Location: PACU  Anesthesia Type:General  Level of Consciousness: drowsy and patient cooperative  Airway & Oxygen Therapy: Patient Spontanous Breathing  Post-op Assessment: Report given to RN and Post -op Vital signs reviewed and stable  Post vital signs: Reviewed and stable  Last Vitals:  Vitals Value Taken Time  BP 122/73 11/23/19 0854  Temp    Pulse 97 11/23/19 0855  Resp 13 11/23/19 0855  SpO2 98 % 11/23/19 0855  Vitals shown include unvalidated device data.  Last Pain:  Vitals:   11/23/19 0602  PainSc: 3       Patients Stated Pain Goal: 3 (48/25/00 3704)  Complications: No apparent anesthesia complications

## 2019-11-23 NOTE — Interval H&P Note (Signed)
History and Physical Interval Note:  11/23/2019 6:44 AM  Michelle Davies  has presented today for surgery, with the diagnosis of CHRONIC CALCULUS CHOLECYSTITIS.  The various methods of treatment have been discussed with the patient and family. After consideration of risks, benefits and other options for treatment, the patient has consented to  Procedure(s): LAPAROSCOPIC CHOLECYSTECTOMY WITH INTRAOPERATIVE CHOLANGIOGRAM (N/A) as a surgical intervention.  The patient's history has been reviewed, patient examined, no change in status, stable for surgery.  I have reviewed the patient's chart and labs.  Questions were answered to the patient's satisfaction.     Maia Petties

## 2019-11-23 NOTE — Discharge Instructions (Signed)
[facility] ______[facility] °LAPAROSCOPIC SURGERY: POST OP INSTRUCTIONS °Always review your discharge instruction sheet given to you by the facility where your surgery was performed. °IF YOU HAVE DISABILITY OR FAMILY LEAVE FORMS, YOU MUST BRING THEM TO THE OFFICE FOR PROCESSING.   °DO NOT GIVE THEM TO YOUR DOCTOR. ° °1. A prescription for pain medication may be given to you upon discharge.  Take your pain medication as prescribed, if needed.  If narcotic pain medicine is not needed, then you may take acetaminophen (Tylenol) or ibuprofen (Advil) as needed. °2. Take your usually prescribed medications unless otherwise directed. °3. If you need a refill on your pain medication, please contact your pharmacy.  They will contact our office to request authorization. Prescriptions will not be filled after 5pm or on week-ends. °4. You should follow a light diet the first few days after arrival home, such as soup and crackers, etc.  Be sure to include lots of fluids daily. °5. Most patients will experience some swelling and bruising in the area of the incisions.  Ice packs will help.  Swelling and bruising can take several days to resolve.  °6. It is common to experience some constipation if taking pain medication after surgery.  Increasing fluid intake and taking a stool softener (such as Colace) will usually help or prevent this problem from occurring.  A mild laxative (Milk of Magnesia or Miralax) should be taken according to package instructions if there are no bowel movements after 48 hours. °7. Unless discharge instructions indicate otherwise, you may remove your bandages 24-48 hours after surgery, and you may shower at that time.  You may have steri-strips (small skin tapes) in place directly over the incision.  These strips should be left on the skin for 7-10 days.  If your surgeon used skin glue on the incision, you may shower in 24 hours.  The glue will flake off over the next 2-3 weeks.  Any sutures or  staples will be removed at the office during your follow-up visit. °8. ACTIVITIES:  You may resume regular (light) daily activities beginning the next day--such as daily self-care, walking, climbing stairs--gradually increasing activities as tolerated.  You may have sexual intercourse when it is comfortable.  Refrain from any heavy lifting or straining until approved by your doctor. °a. You may drive when you are no longer taking prescription pain medication, you can comfortably wear a seatbelt, and you can safely maneuver your car and apply brakes. °b. RETURN TO WORK:  __________________________________________________________ °9. You should see your doctor in the office for a follow-up appointment approximately 2-3 weeks after your surgery.  Make sure that you call for this appointment within a day or two after you arrive home to insure a convenient appointment time. °10. OTHER INSTRUCTIONS: __________________________________________________________________________________________________________________________ __________________________________________________________________________________________________________________________ °WHEN TO CALL YOUR DOCTOR: °1. Fever over 101.0 °2. Inability to urinate °3. Continued bleeding from incision. °4. Increased pain, redness, or drainage from the incision. °5. Increasing abdominal pain ° °The clinic staff is available to answer your questions during regular business hours.  Please don’t hesitate to call and ask to speak to one of the nurses for clinical concerns.  If you have a medical emergency, go to the nearest emergency room or call 911.  A surgeon from [facility] is always on call at the hospital. °[address], [address], [place], [place]  [address] ? [address] [address], [place], [place]   [address] °[phone] ? [phone] ? FAX [phone] °Web site:   [email or web address] °

## 2019-11-23 NOTE — Op Note (Signed)
Laparoscopic Cholecystectomy with IOC Procedure Note  Indications: This patient presents with symptomatic gallbladder disease and will undergo laparoscopic cholecystectomy.  Pre-operative Diagnosis: Calculus of gallbladder with acute cholecystitis, without mention of obstruction  Post-operative Diagnosis: Same  Surgeon: Wynona Luna   Assistants: Patrici Ranks, RNFA  Anesthesia: General endotracheal anesthesia  ASA Class: 1  Procedure Details  The patient was seen again in the Holding Room. The risks, benefits, complications, treatment options, and expected outcomes were discussed with the patient. The possibilities of reaction to medication, pulmonary aspiration, perforation of viscus, bleeding, recurrent infection, finding a normal gallbladder, the need for additional procedures, failure to diagnose a condition, the possible need to convert to an open procedure, and creating a complication requiring transfusion or operation were discussed with the patient. The likelihood of improving the patient's symptoms with return to their baseline status is good.  The patient and/or family concurred with the proposed plan, giving informed consent. The site of surgery properly noted. The patient was taken to Operating Room, identified as Michelle Davies and the procedure verified as Laparoscopic Cholecystectomy with Intraoperative Cholangiogram. A Time Out was held and the above information confirmed.  Prior to the induction of general anesthesia, antibiotic prophylaxis was administered. General endotracheal anesthesia was then administered and tolerated well. After the induction, the abdomen was prepped with Chloraprep and draped in the sterile fashion. The patient was positioned in the supine position.  Local anesthetic agent was injected into the skin near the umbilicus and an incision made. We dissected down to the abdominal fascia with blunt dissection.  The fascia was incised vertically and we  entered the peritoneal cavity bluntly.  A pursestring suture of 0-Vicryl was placed around the fascial opening.  The Hasson cannula was inserted and secured with the stay suture.  Pneumoperitoneum was then created with CO2 and tolerated well without any adverse changes in the patient's vital signs. An 11-mm port was placed in the subxiphoid position.  Two 5-mm ports were placed in the right upper quadrant. All skin incisions were infiltrated with a local anesthetic agent before making the incision and placing the trocars.   We positioned the patient in reverse Trendelenburg, tilted slightly to the patient's left.  The gallbladder was identified, the fundus grasped and retracted cephalad.  The gallbladder was massively distended and thickened. Adhesions were lysed bluntly and with the electrocautery where indicated, taking care not to injure any adjacent organs or viscus. The infundibulum was grasped and retracted laterally, exposing the peritoneum overlying the triangle of Calot. This was then divided and exposed in a blunt fashion. A critical view of the cystic duct and cystic artery was obtained.  The cystic duct was clearly identified and bluntly dissected circumferentially. The cystic duct was ligated with a clip distally.   An incision was made in the cystic duct and the [facility] cholangiogram catheter introduced. The catheter was secured using a clip. A cholangiogram was then obtained which showed good visualization of the distal and proximal biliary tree with no sign of filling defects or obstruction.  Contrast flowed easily into the duodenum. The catheter was then removed.   The cystic duct was then ligated with clips and divided. The cystic artery was identified, dissected free, ligated with clips and divided as well.   The gallbladder was dissected from the liver bed in retrograde fashion with the electrocautery. The gallbladder was removed and placed in an Eco sac. The liver bed was irrigated and  inspected. Hemostasis was achieved with the electrocautery.  Copious irrigation was utilized and was repeatedly aspirated until clear.  The gallbladder and Eco sac were then removed through the umbilical port site. We had to enlarge the fascial opening and the skin incision to remove the large gallbladder.  A 0 Vicryl suture was used to close the fascia.  We again inspected the right upper quadrant for hemostasis.  Pneumoperitoneum was released as we removed the trocars.  4-0 Monocryl was used to close the skin.   Benzoin, steri-strips, and clean dressings were applied. The patient was then extubated and brought to the recovery room in stable condition. Instrument, sponge, and needle counts were correct at closure and at the conclusion of the case.   Findings: Cholecystitis with Cholelithiasis  Estimated Blood Loss: Minimal         Drains: none         Specimens: Gallbladder           Complications: None; patient tolerated the procedure well.         Disposition: PACU - hemodynamically stable.         Condition: stable  Michelle Burn. Georgette Dover, MD, [facility]  General/ Trauma Surgery   11/23/2019 8:33 AM

## 2019-11-23 NOTE — Anesthesia Preprocedure Evaluation (Addendum)
Anesthesia Evaluation  Patient identified by MRN, date of birth, ID band Patient awake    Reviewed: Allergy & Precautions, NPO status , Patient's Chart, lab work & pertinent test results  Airway Mallampati: I  TM Distance: >3 FB Neck ROM: Full    Dental  (+) Teeth Intact, Dental Advisory Given   Pulmonary Current Smoker and Patient abstained from smoking.,    breath sounds clear to auscultation       Cardiovascular negative cardio ROS   Rhythm:Regular Rate:Normal     Neuro/Psych negative neurological ROS  negative psych ROS   GI/Hepatic negative GI ROS, Neg liver ROS,   Endo/Other  negative endocrine ROS  Renal/GU negative Renal ROS     Musculoskeletal negative musculoskeletal ROS (+)   Abdominal Normal abdominal exam  (+)   Peds  Hematology negative hematology ROS (+)   Anesthesia Other Findings   Reproductive/Obstetrics                            Anesthesia Physical Anesthesia Plan  ASA: II  Anesthesia Plan: General   Post-op Pain Management:    Induction: Intravenous  PONV Risk Score and Plan: 3 and Ondansetron, Midazolam and Dexamethasone  Airway Management Planned: Oral ETT  Additional Equipment:   Intra-op Plan:   Post-operative Plan: Extubation in OR  Informed Consent: I have reviewed the patients History and Physical, chart, labs and discussed the procedure including the risks, benefits and alternatives for the proposed anesthesia with the patient or authorized representative who has indicated his/her understanding and acceptance.     Dental advisory given  Plan Discussed with: CRNA  Anesthesia Plan Comments:        Anesthesia Quick Evaluation

## 2019-11-23 NOTE — Anesthesia Procedure Notes (Signed)
Procedure Name: Intubation Date/Time: 11/23/2019 7:32 AM Performed by: Janace Litten, CRNA Pre-anesthesia Checklist: Patient identified, Emergency Drugs available, Suction available and Patient being monitored Patient Re-evaluated:Patient Re-evaluated prior to induction Oxygen Delivery Method: Circle System Utilized Preoxygenation: Pre-oxygenation with 100% oxygen Induction Type: IV induction Ventilation: Mask ventilation without difficulty Laryngoscope Size: Mac and 3 Grade View: Grade I Tube type: Oral Tube size: 7.0 mm Number of attempts: 1 Airway Equipment and Method: Stylet Placement Confirmation: ETT inserted through vocal cords under direct vision,  positive ETCO2 and breath sounds checked- equal and bilateral Secured at: 21 cm Tube secured with: Tape Dental Injury: Teeth and Oropharynx as per pre-operative assessment

## 2019-11-24 ENCOUNTER — Encounter (HOSPITAL_COMMUNITY): Payer: Self-pay | Admitting: Surgery

## 2019-11-26 LAB — SURGICAL PATHOLOGY

## 2020-01-03 DIAGNOSIS — Z20828 Contact with and (suspected) exposure to other viral communicable diseases: Secondary | ICD-10-CM | POA: Diagnosis not present

## 2020-01-09 DIAGNOSIS — Z20828 Contact with and (suspected) exposure to other viral communicable diseases: Secondary | ICD-10-CM | POA: Diagnosis not present

## 2020-01-09 DIAGNOSIS — M791 Myalgia, unspecified site: Secondary | ICD-10-CM | POA: Diagnosis not present

## 2020-01-09 DIAGNOSIS — R0981 Nasal congestion: Secondary | ICD-10-CM | POA: Diagnosis not present

## 2020-01-09 DIAGNOSIS — R5383 Other fatigue: Secondary | ICD-10-CM | POA: Diagnosis not present

## 2020-02-22 DIAGNOSIS — N912 Amenorrhea, unspecified: Secondary | ICD-10-CM | POA: Diagnosis not present

## 2020-04-07 ENCOUNTER — Inpatient Hospital Stay (HOSPITAL_COMMUNITY)
Admission: AD | Admit: 2020-04-07 | Discharge: 2020-04-07 | Disposition: A | Discharge status: Home / Self Care | Payer: BC Managed Care – PPO | Attending: Obstetrics and Gynecology | Admitting: Obstetrics and Gynecology

## 2020-04-07 ENCOUNTER — Inpatient Hospital Stay (HOSPITAL_COMMUNITY): Payer: BC Managed Care – PPO

## 2020-04-07 ENCOUNTER — Other Ambulatory Visit: Payer: Self-pay

## 2020-04-07 ENCOUNTER — Encounter (HOSPITAL_COMMUNITY): Payer: Self-pay | Admitting: Obstetrics and Gynecology

## 2020-04-07 DIAGNOSIS — Z3A01 Less than 8 weeks gestation of pregnancy: Secondary | ICD-10-CM | POA: Diagnosis not present

## 2020-04-07 DIAGNOSIS — Z3A11 11 weeks gestation of pregnancy: Secondary | ICD-10-CM | POA: Insufficient documentation

## 2020-04-07 DIAGNOSIS — Z87891 Personal history of nicotine dependence: Secondary | ICD-10-CM | POA: Insufficient documentation

## 2020-04-07 DIAGNOSIS — O021 Missed abortion: Secondary | ICD-10-CM | POA: Diagnosis not present

## 2020-04-07 DIAGNOSIS — O209 Hemorrhage in early pregnancy, unspecified: Secondary | ICD-10-CM | POA: Diagnosis not present

## 2020-04-07 HISTORY — DX: Calculus of gallbladder without cholecystitis without obstruction: K80.20

## 2020-04-07 LAB — CBC
HCT: 37.6 % (ref 36.0–46.0)
Hemoglobin: 12.8 g/dL (ref 12.0–15.0)
MCH: 32.7 pg (ref 26.0–34.0)
MCHC: 34 g/dL (ref 30.0–36.0)
MCV: 95.9 fL (ref 80.0–100.0)
Platelets: 229 10*3/uL (ref 150–400)
RBC: 3.92 MIL/uL (ref 3.87–5.11)
RDW: 12.3 % (ref 11.5–15.5)
WBC: 7.8 10*3/uL (ref 4.0–10.5)
nRBC: 0 % (ref 0.0–0.2)

## 2020-04-07 LAB — URINALYSIS, ROUTINE W REFLEX MICROSCOPIC
Bacteria, UA: NONE SEEN
Bilirubin Urine: NEGATIVE
Glucose, UA: NEGATIVE mg/dL
Ketones, ur: NEGATIVE mg/dL
Leukocytes,Ua: NEGATIVE
Nitrite: NEGATIVE
Protein, ur: NEGATIVE mg/dL
Specific Gravity, Urine: 1.018 (ref 1.005–1.030)
pH: 8 (ref 5.0–8.0)

## 2020-04-07 LAB — POCT PREGNANCY, URINE: Preg Test, Ur: POSITIVE — AB

## 2020-04-07 LAB — ABO/RH: ABO/RH(D): O POS

## 2020-04-07 LAB — HCG, QUANTITATIVE, PREGNANCY: hCG, Beta Chain, Quant, S: 10242 m[IU]/mL — ABNORMAL HIGH (ref <5)

## 2020-04-07 MED ORDER — ACETAMINOPHEN 325 MG PO TABS
650.0000 mg | ORAL_TABLET | Freq: Once | ORAL | Status: AC
  Administered 2020-04-07: 13:00:00 650 mg via ORAL
  Filled 2020-04-07: qty 2

## 2020-04-07 NOTE — MAU Provider Note (Signed)
History    CSN: [id number]  Arrival date and time: 04/07/20 1005  First Provider Initiated Contact with Patient 04/07/20 1108     Chief Complaint  Patient presents with  . Vaginal Bleeding  . Abdominal Pain   HPI Michelle Davies is a 39 y.o. G1P0000 at [redacted]w[redacted]d who presents to MAU with chief complaint of vaginal bleeding. This is a new problem, onset last weekend and worsening significantly this morning at 0400. Patient endorses saturating a "thick pantyliner" in about one hour this morning.   Patient also c/o lower abdominal cramping. This is a new problem, onset around 0400 today in conjunction with onset of her heavy bleeding. Her pain waxes and wanes from 1 to 7/10. She rates her pain as 1/10 on arrival to MAU. She has not taken medication or tried other treatments for this complaint.  She denies abdominal tenderness, dizziness, fever or recent illness. She is remote from intercourse.   Patient has urine pregnancy test with [facility] and has an appointment scheduled there for follow-up on 04/14/2020.  OB History    Gravida  1   Para  0   Term  0   Preterm  0   AB  0   Living  0     SAB  0   TAB  0   Ectopic  0   Multiple  0   Live Births  0           Past Medical History:  Diagnosis Date  . Alopecia areata   . Gall stones   . IBS (irritable bowel syndrome)   . Pneumonia    walking pneumonia    Past Surgical History:  Procedure Laterality Date  . CHOLECYSTECTOMY N/A 11/23/2019   Procedure: LAPAROSCOPIC CHOLECYSTECTOMY WITH INTRAOPERATIVE CHOLANGIOGRAM;  Surgeon: Manus Rudd, MD;  Location: [facility] OR;  Service: General;  Laterality: N/A;  . WISDOM TOOTH EXTRACTION      History reviewed. No pertinent family history.  Social History   Tobacco Use  . Smoking status: Former Smoker    Packs/day: 0.25    Types: Cigarettes  . Smokeless tobacco: Never Used  Substance Use Topics  . Alcohol use: Not Currently    Comment: rare  . Drug use: Not  Currently    Comment: none since mid twenties    Allergies: No Known Allergies  Medications Prior to Admission  Medication Sig Dispense Refill Last Dose  . acetaminophen (TYLENOL) 500 MG tablet Take 1,000 mg by mouth every 6 (six) hours as needed for mild pain.   Past Week at Unknown time  . Prenatal Vit-Fe Fumarate-FA (MULTIVITAMIN-PRENATAL) 27-0.8 MG TABS tablet Take 1 tablet by mouth daily at 12 noon.   Past Week at Unknown time  . Cholecalciferol (VITAMIN D3) 125 MCG (5000 UT) TABS Take 5,000 Units by mouth daily.     Marland Kitchen ezetimibe-simvastatin (VYTORIN) 10-40 MG tablet Take 1 tablet by mouth every evening.      . hydroxychloroquine (PLAQUENIL) 200 MG tablet Take 200 mg by mouth 2 (two) times daily.     Marland Kitchen ibuprofen (ADVIL) 200 MG tablet Take 400-600 mg by mouth every 8 (eight) hours as needed (for pain.).     Marland Kitchen Multiple Vitamin (MULTIVITAMIN WITH MINERALS) TABS tablet Take 1 tablet by mouth daily.     . ondansetron (ZOFRAN-ODT) 4 MG disintegrating tablet Take 4 mg by mouth every 8 (eight) hours as needed for nausea or vomiting.     Marland Kitchen oxyCODONE (OXY IR/ROXICODONE) 5 MG immediate  release tablet Take 1 tablet (5 mg total) by mouth every 6 (six) hours as needed for severe pain. 20 tablet 0   . Probiotic Product (PROBIOTIC PO) Take 1 capsule by mouth every Monday, Tuesday, Wednesday, Thursday, and Friday. 5 time a week (at night)     . traMADol (ULTRAM) 50 MG tablet Take 50 mg by mouth every 6 (six) hours as needed for pain.       Review of Systems  Gastrointestinal: Positive for abdominal pain.  Genitourinary: Positive for vaginal bleeding.  All other systems reviewed and are negative.  Physical Exam   Blood pressure 112/63, pulse 73, temperature 99.2 F (37.3 C), temperature source Oral, resp. rate 16, height 5\' 6"  (1.676 m), weight 82.9 kg, last menstrual period 01/20/2020, SpO2 100 %.  Physical Exam  Nursing note and vitals reviewed. Constitutional: She is oriented to person, place,  and time. She appears well-developed and well-nourished.  Cardiovascular: Normal rate and normal heart sounds.  Respiratory: Effort normal and breath sounds normal.  GI: Soft. Bowel sounds are normal. She exhibits no distension. There is no abdominal tenderness. There is no rebound and no guarding.  Genitourinary:    Genitourinary Comments: Pelvic exam: External genitalia normal, vaginal walls pink and well rugated, cervix visually closed, no lesions noted. Moderate dark red clot visualized at cervical os. Removed with fox swab x 1. Bimanual: declined by patient due to impending transvaginal ultrasound   Neurological: She is alert and oriented to person, place, and time.  Skin: Skin is warm and dry.  Psychiatric: She has a normal mood and affect. Her behavior is normal. Judgment and thought content normal.    MAU Course/MDM  Procedures: sterile speculum exam, transvaginal ultrasound  --Radiology report not crossing over into Epic. Formal report recited by [facility] via phone call: IUP measuring 6w 1d, no cardiac activity, CRL 4.9 mm --Findings meet [facility] criteria for suspicion for failed pregnancy. Patient counseled about possible impending miscarriage, warning signs, when to return to MAU    Patient Vitals for the past 24 hrs:  BP Temp Temp src Pulse Resp SpO2 Height Weight  04/07/20 1348 -- -- -- -- 16 -- -- --  04/07/20 1038 112/63 99.2 F (37.3 C) Oral 73 16 100 % 5\' 6"  (1.676 m) 82.9 kg   Results for orders placed or performed during the hospital encounter of 04/07/20 (from the past 24 hour(s))  Urinalysis, Routine w reflex microscopic     Status: Abnormal   Collection Time: 04/07/20 10:31 AM  Result Value Ref Range   Color, Urine YELLOW YELLOW   APPearance CLEAR CLEAR   Specific Gravity, Urine 1.018 1.005 - 1.030   pH 8.0 5.0 - 8.0   Glucose, UA NEGATIVE NEGATIVE mg/dL   Hgb urine dipstick SMALL (A) NEGATIVE   Bilirubin Urine NEGATIVE NEGATIVE   Ketones, ur NEGATIVE  NEGATIVE mg/dL   Protein, ur NEGATIVE NEGATIVE mg/dL   Nitrite NEGATIVE NEGATIVE   Leukocytes,Ua NEGATIVE NEGATIVE   RBC / HPF 6-10 0 - 5 RBC/hpf   WBC, UA 0-5 0 - 5 WBC/hpf   Bacteria, UA NONE SEEN NONE SEEN   Squamous Epithelial / LPF 0-5 0 - 5  Pregnancy, urine POC     Status: Abnormal   Collection Time: 04/07/20 10:54 AM  Result Value Ref Range   Preg Test, Ur POSITIVE (A) NEGATIVE  CBC     Status: None   Collection Time: 04/07/20 11:02 AM  Result Value Ref Range   WBC  7.8 4.0 - 10.5 K/uL   RBC 3.92 3.87 - 5.11 MIL/uL   Hemoglobin 12.8 12.0 - 15.0 g/dL   HCT 37.6 36.0 - 46.0 %   MCV 95.9 80.0 - 100.0 fL   MCH 32.7 26.0 - 34.0 pg   MCHC 34.0 30.0 - 36.0 g/dL   RDW 12.3 11.5 - 15.5 %   Platelets 229 150 - 400 K/uL   nRBC 0.0 0.0 - 0.2 %  hCG, quantitative, pregnancy     Status: Abnormal   Collection Time: 04/07/20 11:02 AM  Result Value Ref Range   hCG, Beta Chain, Quant, S 10,242 (H) <5 mIU/mL  ABO/Rh     Status: None   Collection Time: 04/07/20 11:02 AM  Result Value Ref Range   ABO/RH(D) O POS    No rh immune globuloin      NOT A RH IMMUNE GLOBULIN CANDIDATE, PT RH POSITIVE Performed at [facility] [address] [place], [place] [address]    Assessment and Plan  --39 y.o. G1P0000 at [redacted]w[redacted]d  --Concern for possible pregnancy failure --Blood type O POS --Discharge home in stable condition with precautions  F/U: --[facility]. Pt has appt 04/19 --Attempted to leave voicemail with clinic per MAU communication guidelines. Unable to reach secretary after 3 attempts --Will forward note to Dr. Cindie Laroche provider on call  Darlina Rumpf, CNM 04/07/2020, 3:43 PM

## 2020-04-07 NOTE — Discharge Instructions (Signed)

## 2020-04-07 NOTE — MAU Note (Signed)
Thinks she might have had a miscarriage.  Had some light bleeding over the weekend.  This morning, awakened with cramping/ bleeding was heavier (filled up a pantiliner, some stringy clotted looking blood) at 0400, still having some pain and a little bleeding.  preg confirmed at [facility]. 1st OB next Monday

## 2020-04-10 ENCOUNTER — Encounter (HOSPITAL_COMMUNITY): Payer: Self-pay | Admitting: Obstetrics & Gynecology

## 2020-04-10 ENCOUNTER — Inpatient Hospital Stay (HOSPITAL_COMMUNITY): Payer: BC Managed Care – PPO

## 2020-04-10 ENCOUNTER — Inpatient Hospital Stay (HOSPITAL_COMMUNITY)
Admission: AD | Admit: 2020-04-10 | Discharge: 2020-04-11 | Disposition: A | Discharge status: Home / Self Care | Payer: BC Managed Care – PPO | Attending: Obstetrics & Gynecology | Admitting: Obstetrics & Gynecology

## 2020-04-10 DIAGNOSIS — O200 Threatened abortion: Secondary | ICD-10-CM

## 2020-04-10 DIAGNOSIS — Z87891 Personal history of nicotine dependence: Secondary | ICD-10-CM | POA: Diagnosis not present

## 2020-04-10 DIAGNOSIS — O209 Hemorrhage in early pregnancy, unspecified: Secondary | ICD-10-CM | POA: Diagnosis not present

## 2020-04-10 DIAGNOSIS — Z3A01 Less than 8 weeks gestation of pregnancy: Secondary | ICD-10-CM | POA: Diagnosis not present

## 2020-04-10 DIAGNOSIS — Z3A11 11 weeks gestation of pregnancy: Secondary | ICD-10-CM | POA: Insufficient documentation

## 2020-04-10 NOTE — MAU Provider Note (Addendum)
Chief Complaint: Abdominal Pain and Vaginal Bleeding   First Provider Initiated Contact with Patient 04/10/20 2304        SUBJECTIVE HPI: Michelle Davies is a 39 y.o. G1P0000 at [redacted]w[redacted]d by LMP who presents to maternity admissions reporting having had an episode of heavy bleeding with passage of clots at home. Had period like bleeding for the past two days, then it stopped today.  This evening had about an hour of heavy bleeding and clots.  Minimal cramping.  Was told her pregnancy was likely a missed abortion, but not fully confirmed. She denies vaginal itching/burning, urinary symptoms, h/a, dizziness, n/v, or fever/chills.    Abdominal Pain This is a recurrent problem. The current episode started today. The onset quality is gradual. The problem occurs intermittently. The problem has been gradually improving. The pain is mild. The quality of the pain is cramping. The abdominal pain does not radiate. Associated symptoms include diarrhea. Pertinent negatives include no constipation, dysuria, fever, frequency, headaches, nausea or vomiting. Nothing aggravates the pain. She has tried acetaminophen for the symptoms.  Vaginal Bleeding The patient's primary symptoms include pelvic pain and vaginal bleeding. The patient's pertinent negatives include no genital itching, genital lesions or genital odor. This is a recurrent problem. The current episode started today. The problem occurs intermittently. The problem has been gradually improving. She is pregnant. Associated symptoms include abdominal pain and diarrhea. Pertinent negatives include no constipation, dysuria, fever, frequency, headaches, nausea or vomiting. The vaginal discharge was bloody. The vaginal bleeding is heavier than menses. She has been passing clots. Nothing aggravates the symptoms. She has tried acetaminophen for the symptoms. The treatment provided mild relief.   RN Note: Was seen here Monday and told there was no heart beat on u/s.  Started having heavy bleeding tonight about 1800. Soaking pads and having some cramping. Tylenol has helped the cramping somewhat. Has had some diarrhea as well  Past Medical History:  Diagnosis Date  . Alopecia areata   . Gall stones   . IBS (irritable bowel syndrome)   . Pneumonia    walking pneumonia   Past Surgical History:  Procedure Laterality Date  . CHOLECYSTECTOMY N/A 11/23/2019   Procedure: LAPAROSCOPIC CHOLECYSTECTOMY WITH INTRAOPERATIVE CHOLANGIOGRAM;  Surgeon: Donnie Mesa, MD;  Location: [facility];  Service: General;  Laterality: N/A;  . WISDOM TOOTH EXTRACTION     Social History   Socioeconomic History  . Marital status: Married    Spouse name: Not on file  . Number of children: Not on file  . Years of education: Not on file  . Highest education level: Not on file  Occupational History  . Not on file  Tobacco Use  . Smoking status: Former Smoker    Packs/day: 0.25    Types: Cigarettes  . Smokeless tobacco: Never Used  Substance and Sexual Activity  . Alcohol use: Not Currently    Comment: rare  . Drug use: Not Currently    Comment: none since mid twenties  . Sexual activity: Yes  Other Topics Concern  . Not on file  Social History Narrative  . Not on file   Social Determinants of Health   Financial Resource Strain:   . Difficulty of Paying Living Expenses:   Food Insecurity:   . Worried About Charity fundraiser in the Last Year:   . Arboriculturist in the Last Year:   Transportation Needs:   . Film/video editor (Medical):   Marland Kitchen Lack of Transportation (Non-Medical):  Physical Activity:   . Days of Exercise per Week:   . Minutes of Exercise per Session:   Stress:   . Feeling of Stress :   Social Connections:   . Frequency of Communication with Friends and Family:   . Frequency of Social Gatherings with Friends and Family:   . Attends Religious Services:   . Active Member of Clubs or Organizations:   . Attends Banker Meetings:    Marland Kitchen Marital Status:   Intimate Partner Violence:   . Fear of Current or Ex-Partner:   . Emotionally Abused:   Marland Kitchen Physically Abused:   . Sexually Abused:    No current facility-administered medications on file prior to encounter.   Current Outpatient Medications on File Prior to Encounter  Medication Sig Dispense Refill  . Cholecalciferol (VITAMIN D3) 125 MCG (5000 UT) TABS Take 5,000 Units by mouth daily.    Marland Kitchen ezetimibe-simvastatin (VYTORIN) 10-40 MG tablet Take 1 tablet by mouth every evening.     . hydroxychloroquine (PLAQUENIL) 200 MG tablet Take 200 mg by mouth 2 (two) times daily.    Marland Kitchen ibuprofen (ADVIL) 200 MG tablet Take 400-600 mg by mouth every 8 (eight) hours as needed (for pain.).    Marland Kitchen Multiple Vitamin (MULTIVITAMIN WITH MINERALS) TABS tablet Take 1 tablet by mouth daily.    . ondansetron (ZOFRAN-ODT) 4 MG disintegrating tablet Take 4 mg by mouth every 8 (eight) hours as needed for nausea or vomiting.    Marland Kitchen oxyCODONE (OXY IR/ROXICODONE) 5 MG immediate release tablet Take 1 tablet (5 mg total) by mouth every 6 (six) hours as needed for severe pain. 20 tablet 0  . Probiotic Product (PROBIOTIC PO) Take 1 capsule by mouth every Monday, Tuesday, Wednesday, Thursday, and Friday. 5 time a week (at night)    . traMADol (ULTRAM) 50 MG tablet Take 50 mg by mouth every 6 (six) hours as needed for pain.     No Known Allergies  I have reviewed patient's Past Medical Hx, Surgical Hx, Family Hx, Social Hx, medications and allergies.   ROS:  Review of Systems  Constitutional: Negative for fever.  Gastrointestinal: Positive for abdominal pain and diarrhea. Negative for constipation, nausea and vomiting.  Genitourinary: Positive for pelvic pain and vaginal bleeding. Negative for dysuria and frequency.  Neurological: Negative for headaches.   Review of Systems  Other systems negative   Physical Exam  Physical Exam Patient Vitals for the past 24 hrs:  Height Weight  04/10/20 2248 5\' 6"   (1.676 m) 83 kg   Constitutional: Well-developed, well-nourished female in no acute distress.  Cardiovascular: normal rate Respiratory: normal effort GI: Abd soft, non-tender. Pos BS x 4 MS: Extremities nontender, no edema, normal ROM Neurologic: Alert and oriented x 4.  GU: Neg CVAT.  PELVIC EXAM: Cervix pink, visually closed, without lesion, small bloody discharge, vaginal walls and external genitalia normal Bimanual exam deferred due to recent exam and plan for    LAB RESULTS No results found for this or any previous visit (from the past 24 hour(s)).   Ref. Range 04/07/2020 11:02  Hemoglobin Latest Ref Range: 12.0 - 15.0 g/dL 06/07/2020  HCT Latest Ref Range: 36.0 - 46.0 % 37.6   --/--/O POS (04/12 1102)  IMAGING 04-03-1974 OB Transvaginal  Result Date: 04/10/2020 CLINICAL DATA:  Follow-up bleeding EXAM: TRANSVAGINAL OB ULTRASOUND TECHNIQUE: Transvaginal ultrasound was performed for complete evaluation of the gestation as well as the maternal uterus, adnexal regions, and pelvic cul-de-sac. COMPARISON:  Ultrasound  04/07/2020 FINDINGS: Intrauterine gestational sac: Single Yolk sac:  Visualized Embryo:  Visualized Cardiac Activity: Not visualized CRL: 4.1 mm   6 w 1 d                  [place] EDC: 12/03/2020 Subchorionic hemorrhage:  None visualized. Maternal uterus/adnexae: Left ovary not evaluated. The right ovary is normal and measures 1.9 by 2.9 x 1.9 cm. IMPRESSION: Single IUP with visible gestational sac, yolk sac and embryo but no fetal cardiac activity. Findings are suspicious but not yet definitive for failed pregnancy. Recommend follow-up US in 10-14 days from the ultrasound dated 04/07/2020 for definitive diagnosis. This recommendation follows SRU consensus guidelines: Diagnostic Criteria for Nonviable Pregnancy Early in the First Trimester. Malva Limes Med 2013; 620:3559-74. Electronically Signed   By: Jasmine Pang M.D.   On: 04/10/2020 23:44   US OB Transvaginal  Result Date:  04/07/2020 CLINICAL DATA:  Vaginal bleeding EXAM: TRANSVAGINAL OB ULTRASOUND TECHNIQUE: Transvaginal ultrasound was performed for complete evaluation of the gestation as well as the maternal uterus, adnexal regions, and pelvic cul-de-sac. COMPARISON:  None. FINDINGS: Intrauterine gestational sac: Single Yolk sac:  Visualized Embryo:  Visualized Cardiac Activity: Not visualized Heart Rate:  bpm MSD:   mm    w     d CRL:   4.9 mm   6 w 1 d                  [place] EDC: 11/30/2020 Subchorionic hemorrhage:  None visualized. Maternal uterus/adnexae: No adnexal mass or free fluid. IMPRESSION: Six week 1 day intrauterine pregnancy. Fetal cardiac activity not detected at this time. Recommend follow-up ultrasound in 10-14 days to ensure expected progression. No acute maternal findings. Electronically Signed   By: Charlett Nose M.D.   On: 04/07/2020 12:23    MAU Management/MDM: DIscussed results Reviewed data suggests this represents a missed abortion but does not meed full criteria yet.  Patient has appointment Monday at [facility].  I reviewed the natural history of a spontaneous abortion.  Will likely have up to 2-4 hours of heavy bleeding which should be well tolerated given her baseline hemoglobin.  She may call the on call [facility] staff or come back here if bleeding concerns her Otherwise followup Monday as scheduled   ASSESSMENT 1. Vaginal bleeding in pregnancy, first trimester   2.    Threatened abortion at [redacted]w[redacted]d by LMP, [redacted]w[redacted]d by [place]  PLAN Discharge home Keep appointment at [facility] Monday Bleeding precautions Pt stable at time of discharge. Encouraged to return here or to other Urgent Care/ED if she develops worsening of symptoms, increase in pain, fever, or other concerning symptoms.    Wynelle Bourgeois CNM, MSN Certified Nurse-Midwife 04/10/2020  11:04 PM

## 2020-04-10 NOTE — MAU Note (Signed)
Was seen here Monday and told there was no heart beat on u/s. Started having heavy bleeding tonight about 1800. Soaking pads and having some cramping. Tylenol has helped the cramping somewhat. Has had some diarrhea as well

## 2020-04-11 DIAGNOSIS — Z3A11 11 weeks gestation of pregnancy: Secondary | ICD-10-CM

## 2020-04-11 DIAGNOSIS — O200 Threatened abortion: Secondary | ICD-10-CM

## 2020-04-11 NOTE — Progress Notes (Addendum)
Wynelle Bourgeois CNM in earlier to discuss u/s results and d/c plan. Written and verbal d/c instructions given and understanding voiced. 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

## 2020-04-11 NOTE — Discharge Instructions (Signed)
Threatened Miscarriage  A threatened miscarriage occurs when a woman has vaginal bleeding during the first 20 weeks of pregnancy but the pregnancy has not ended. If you have vaginal bleeding during this time, your health care provider will do tests to make sure you are still pregnant. If the tests show that you are still pregnant and that the developing baby (fetus) inside your uterus is still growing, your condition is considered a threatened miscarriage. A threatened miscarriage does not mean your pregnancy will end, but it does increase the risk of losing your pregnancy (complete miscarriage). What are the causes? The cause of this condition is usually not known. For women who go on to have a complete miscarriage, the most common cause is an abnormal number of chromosomes in the developing baby. Chromosomes are the structures inside cells that hold all of a person's genetic material. What increases the risk? The following lifestyle factors may increase your risk of a miscarriage in early pregnancy:  Smoking.  Drinking excessive amounts of alcohol or caffeine.  Recreational drug use. The following preexisting health conditions may increase your risk of a miscarriage in early pregnancy:  Polycystic ovary syndrome.  Uterine fibroids.  Infections.  Diabetes mellitus. What are the signs or symptoms? Symptoms of this condition include:  Vaginal bleeding.  Mild abdominal pain or cramps. How is this diagnosed? If you have bleeding with or without abdominal pain before 20 weeks of pregnancy, your health care provider will do tests to check whether you are still pregnant. These will include:  Ultrasound. This test uses sound waves to create images of the inside of your uterus. This allows your health care provider to look at your developing baby and other structures, such as your placenta.  Pelvic exam. This is an internal exam of your vagina and cervix.  Measurement of your baby's heart  rate.  Laboratory tests such as blood tests, urine tests, or swabs for infection You may be diagnosed with a threatened miscarriage if:  Ultrasound testing shows that you are still pregnant.  Your baby's heart rate is strong.  A pelvic exam shows that the opening between your uterus and your vagina (cervix) is closed.  Blood tests confirm that you are still pregnant. How is this treated? No treatments have been shown to prevent a threatened miscarriage from going on to a complete miscarriage. However, the right home care is important. Follow these instructions at home:  Get plenty of rest.  Do not have sex or use tampons if you have vaginal bleeding.  Do not douche.  Do not smoke or use recreational drugs.  Do not drink alcohol.  Avoid caffeine.  Keep all follow-up prenatal visits as told by your health care provider. This is important. Contact a health care provider if:  You have light vaginal bleeding or spotting while pregnant.  You have abdominal pain or cramping.  You have a fever. Get help right away if:  You have heavy vaginal bleeding.  You have blood clots coming from your vagina.  You pass tissue from your vagina.  You leak fluid, or you have a gush of fluid from your vagina.  You have severe low back pain or abdominal cramps.  You have fever, chills, and severe abdominal pain. Summary  A threatened miscarriage occurs when a woman has vaginal bleeding during the first 20 weeks of pregnancy but the pregnancy has not ended.  The cause of a threatened miscarriage is usually not known.  Symptoms of this condition may   include vaginal bleeding and mild abdominal pain or cramps.  No treatments have been shown to prevent a threatened miscarriage from going on to a complete miscarriage.  Keep all follow-up prenatal visits as told by your health care provider. This is important. This information is not intended to replace advice given to you by your health  care provider. Make sure you discuss any questions you have with your health care provider. Document Revised: 01/19/2018 Document Reviewed: 03/11/2017 Elsevier Patient Education  2020 Elsevier Inc.  

## 2020-04-14 DIAGNOSIS — O039 Complete or unspecified spontaneous abortion without complication: Secondary | ICD-10-CM | POA: Diagnosis not present

## 2020-04-14 DIAGNOSIS — Z3A12 12 weeks gestation of pregnancy: Secondary | ICD-10-CM | POA: Diagnosis not present

## 2020-04-22 DIAGNOSIS — O039 Complete or unspecified spontaneous abortion without complication: Secondary | ICD-10-CM | POA: Diagnosis not present

## 2020-04-29 DIAGNOSIS — O039 Complete or unspecified spontaneous abortion without complication: Secondary | ICD-10-CM | POA: Diagnosis not present

## 2020-07-25 DIAGNOSIS — L638 Other alopecia areata: Secondary | ICD-10-CM | POA: Diagnosis not present

## 2020-07-25 DIAGNOSIS — Z79899 Other long term (current) drug therapy: Secondary | ICD-10-CM | POA: Diagnosis not present

## 2020-08-15 IMAGING — US US ABDOMEN LIMITED
1 series · 13 of 25 positions shown · non-contrast
Comparison: None.

CLINICAL DATA: Right upper quadrant pain

EXAM:
ULTRASOUND ABDOMEN LIMITED RIGHT UPPER QUADRANT

[Series 1: us abdomen limited · 0.23mm/px · 13 of 96 slices shown]
[im 1/96]
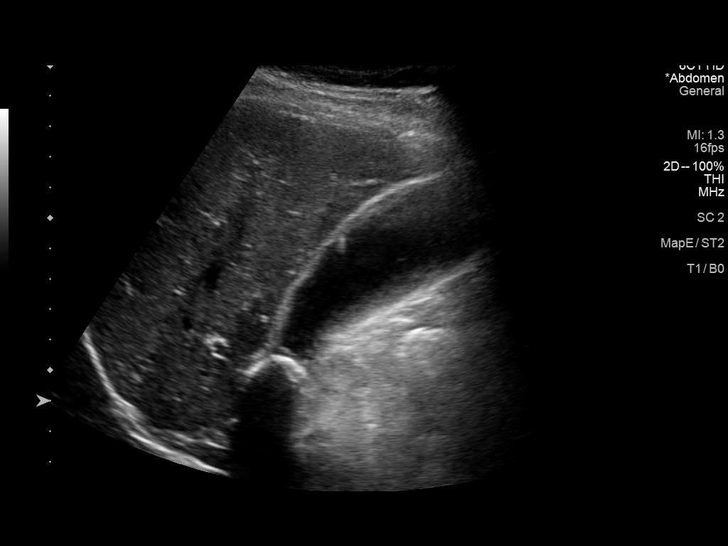
[im 8/96]
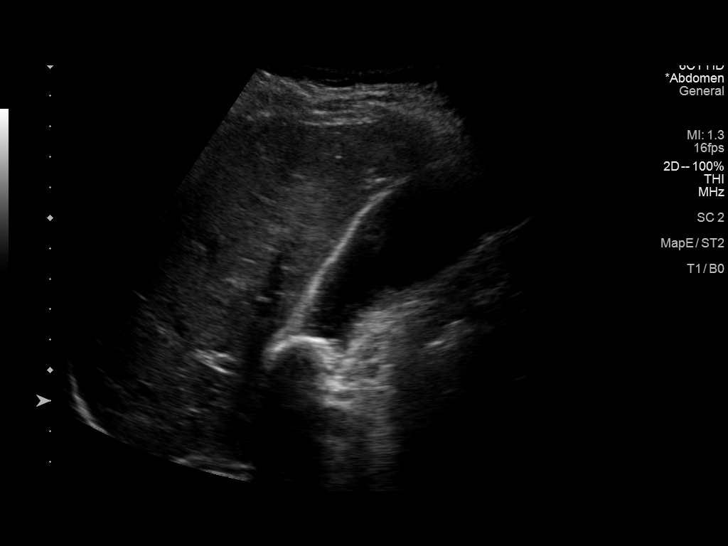
[im 16/96]
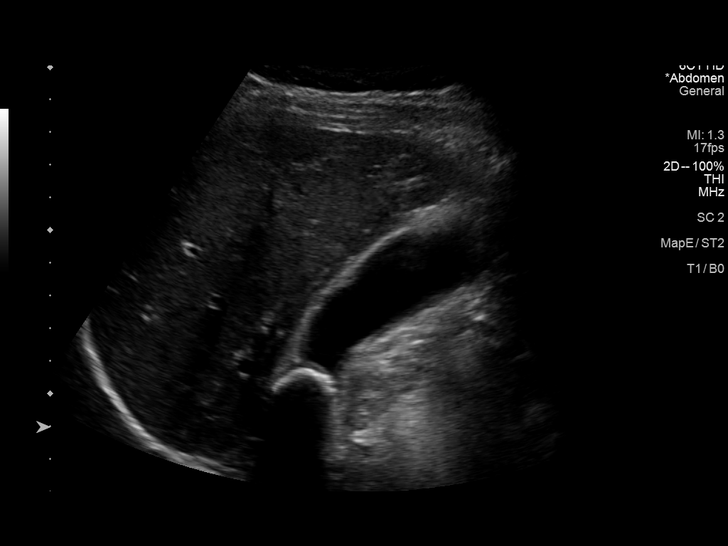
[im 24/96]
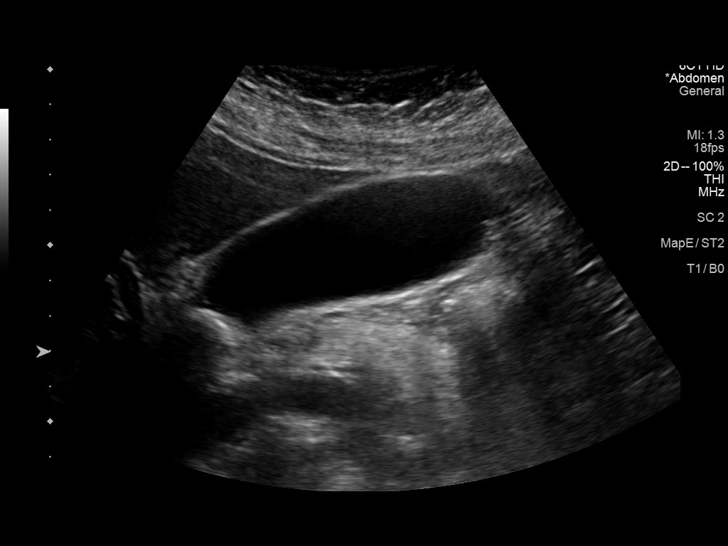
[im 32/96]
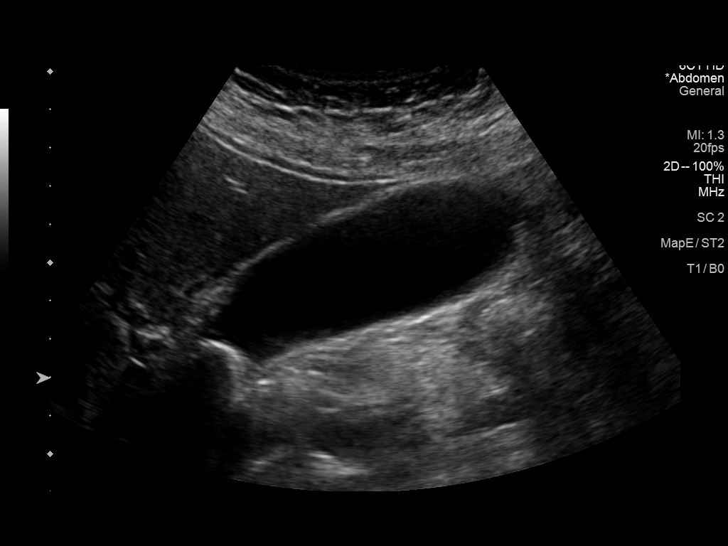
[im 40/96]
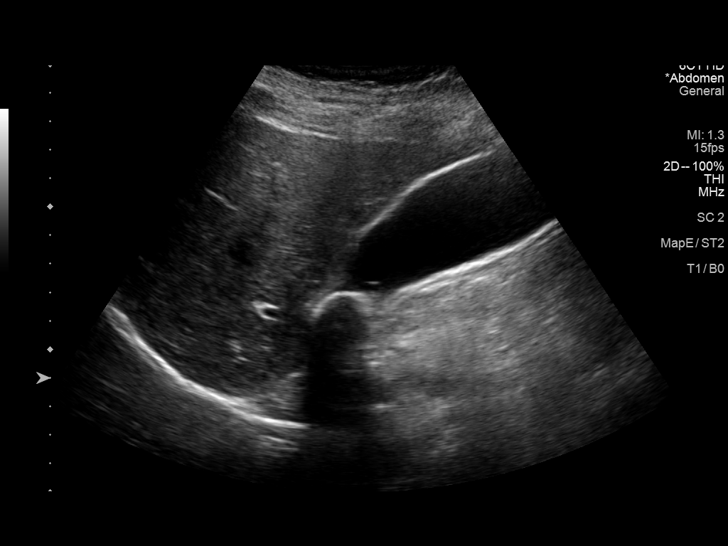
[im 48/96]
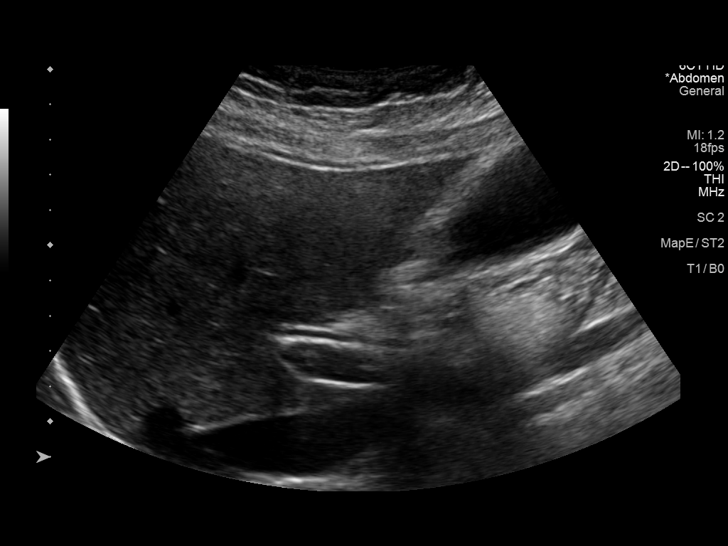
[im 56/96]
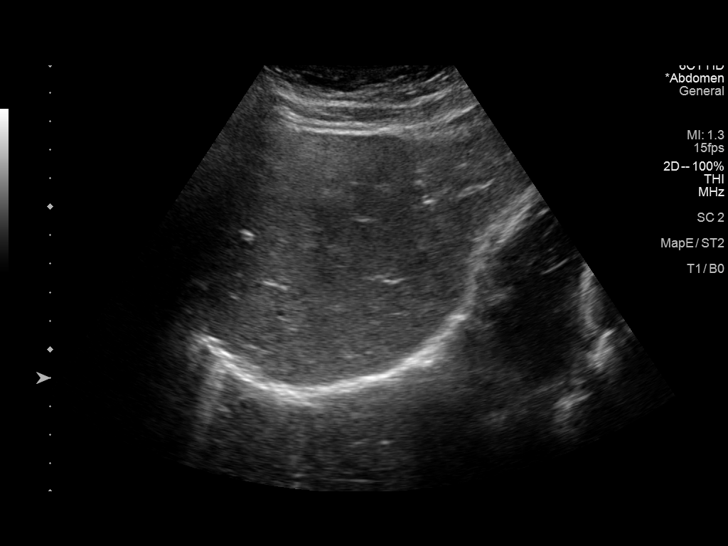
[im 64/96]
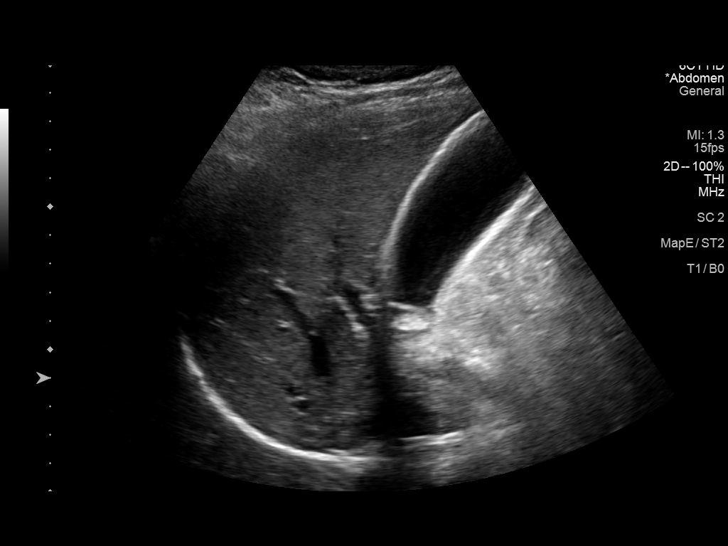
[im 72/96]
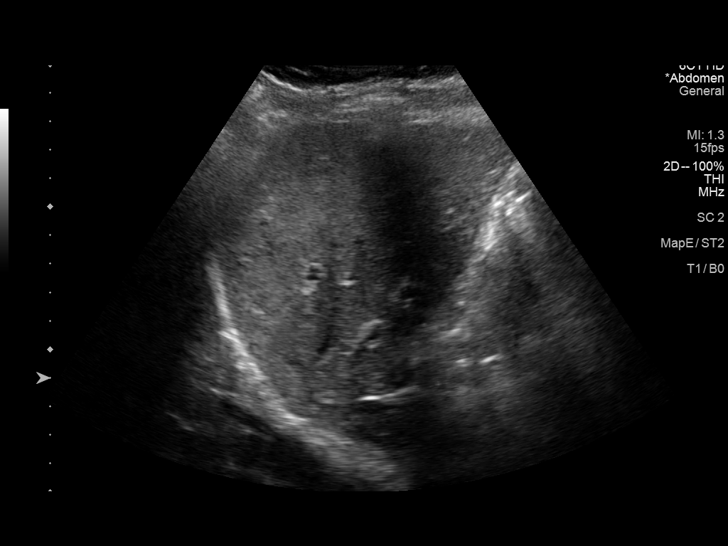
[im 80/96]
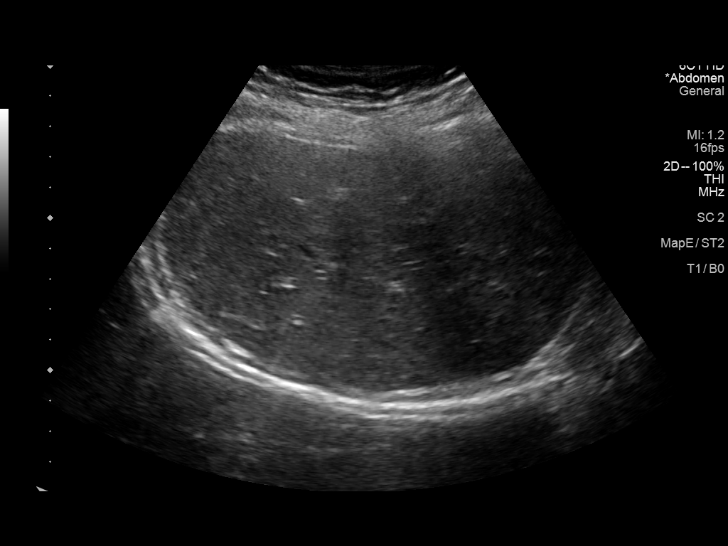
[im 88/96]
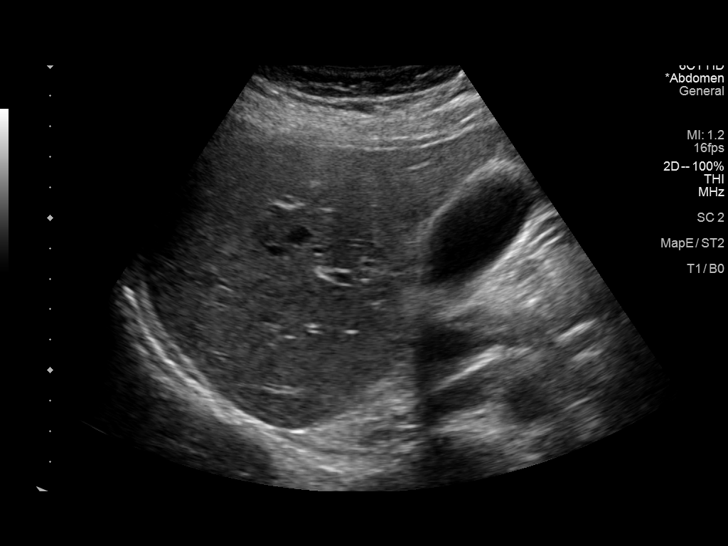
[im 96/96]
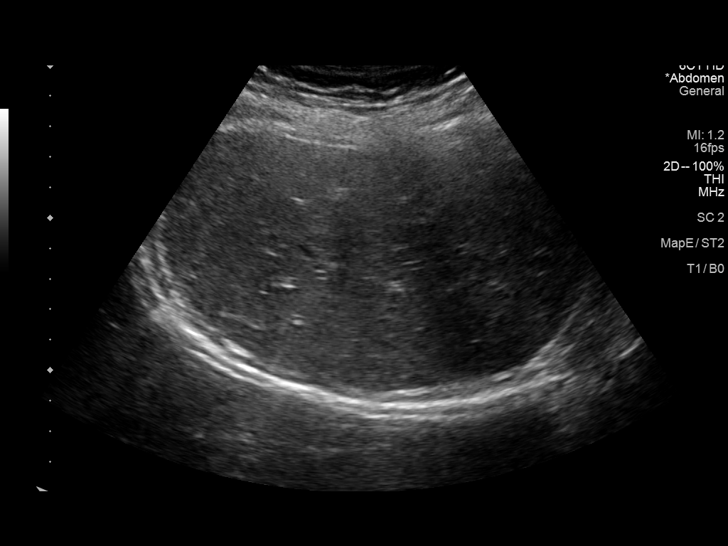

[13 of 25 positions shown; findings below may reference images not displayed]

FINDINGS: Gallbladder:

Non mobile shadowing stone at the gallbladder neck. Normal wall
thickness. Positive sonographic Murphy. Additional nonshadowing
focus at the gallbladder fundus either representing tumefactive
sludge or nonshadowing stone. Focus of comet tail artifact arising
from the anterior gallbladder wall.

Common bile duct:

Diameter: 2 mm

Liver:

No focal lesion identified. Within normal limits in parenchymal
echogenicity. Portal vein is patent on color Doppler imaging with
normal direction of blood flow towards the liver.

Other: None.
IMPRESSION: 1. Non mobile stone at the gallbladder neck with positive
sonographic Murphy, raises some concern for an acute cholecystitis.
Wall thickness is normal and there is no pericholecystic fluid.
Consider correlation with nuclear medicine hepatobiliary imaging
2. No biliary dilatation
3. Probable mild gallbladder adenomyomatosis

These results will be called to the ordering clinician or
representative by the Radiologist Assistant, and communication
documented in the PACS or zVision Dashboard.

## 2020-08-25 IMAGING — RF DG CHOLANGIOGRAM OPERATIVE
1 series · 5 of 5 positions shown · non-contrast
Comparison: None.

CLINICAL DATA: Right upper abdominal pain, cholelithiasis

EXAM:
INTRAOPERATIVE CHOLANGIOGRAM
TECHNIQUE: Cholangiographic images from the C-arm fluoroscopic device were
submitted for interpretation post-operatively. Please see the
procedural report for the amount of contrast and the fluoroscopy
time utilized.

[Series 1: run · 2 acquisitions, 5 frames shown]
[im 1/2]
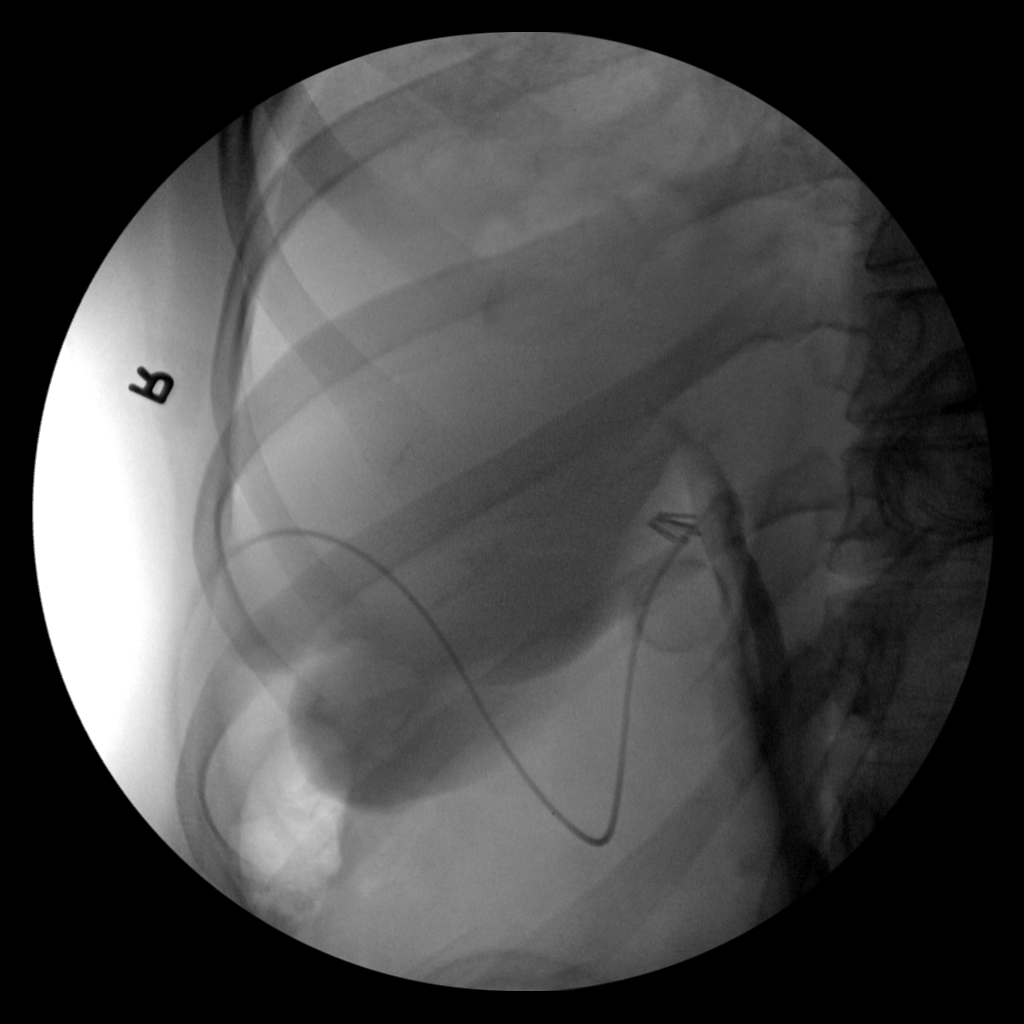
[im 1/2]
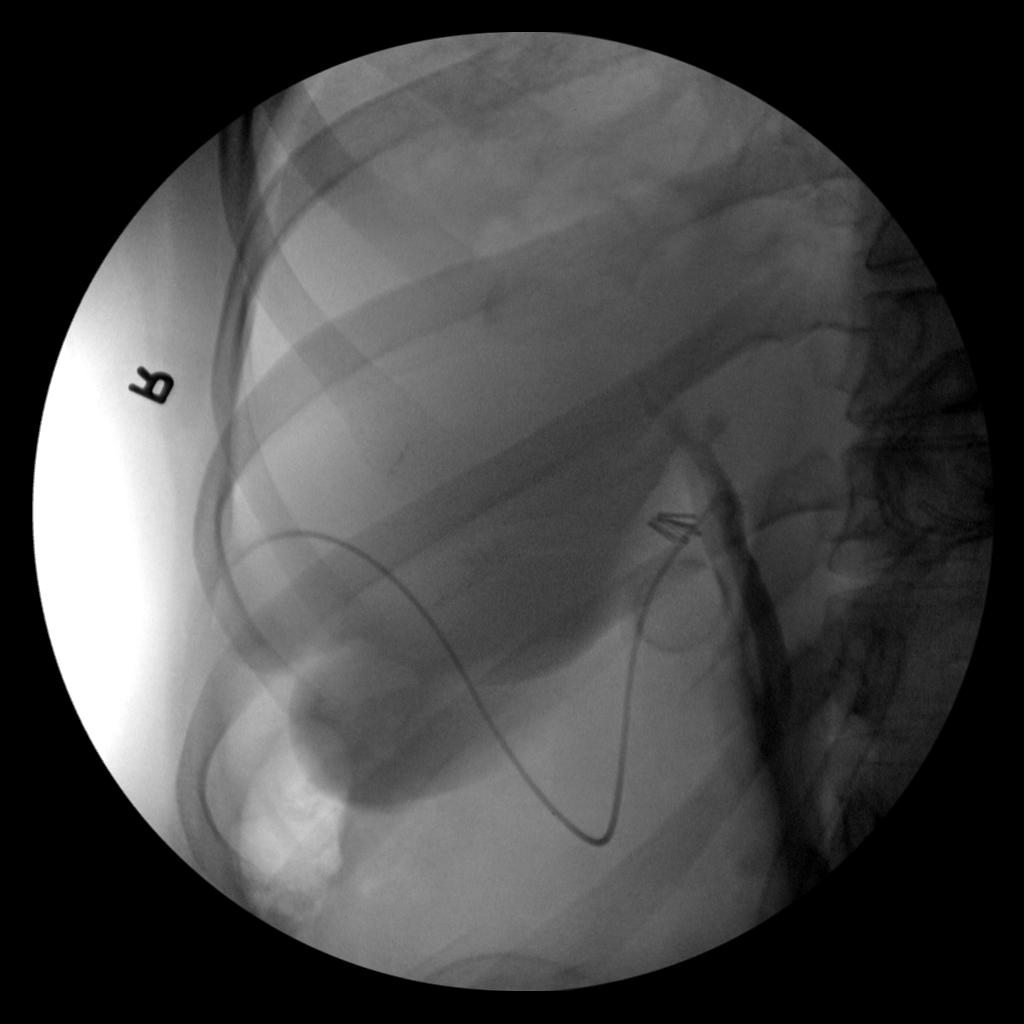
[im 1/2]
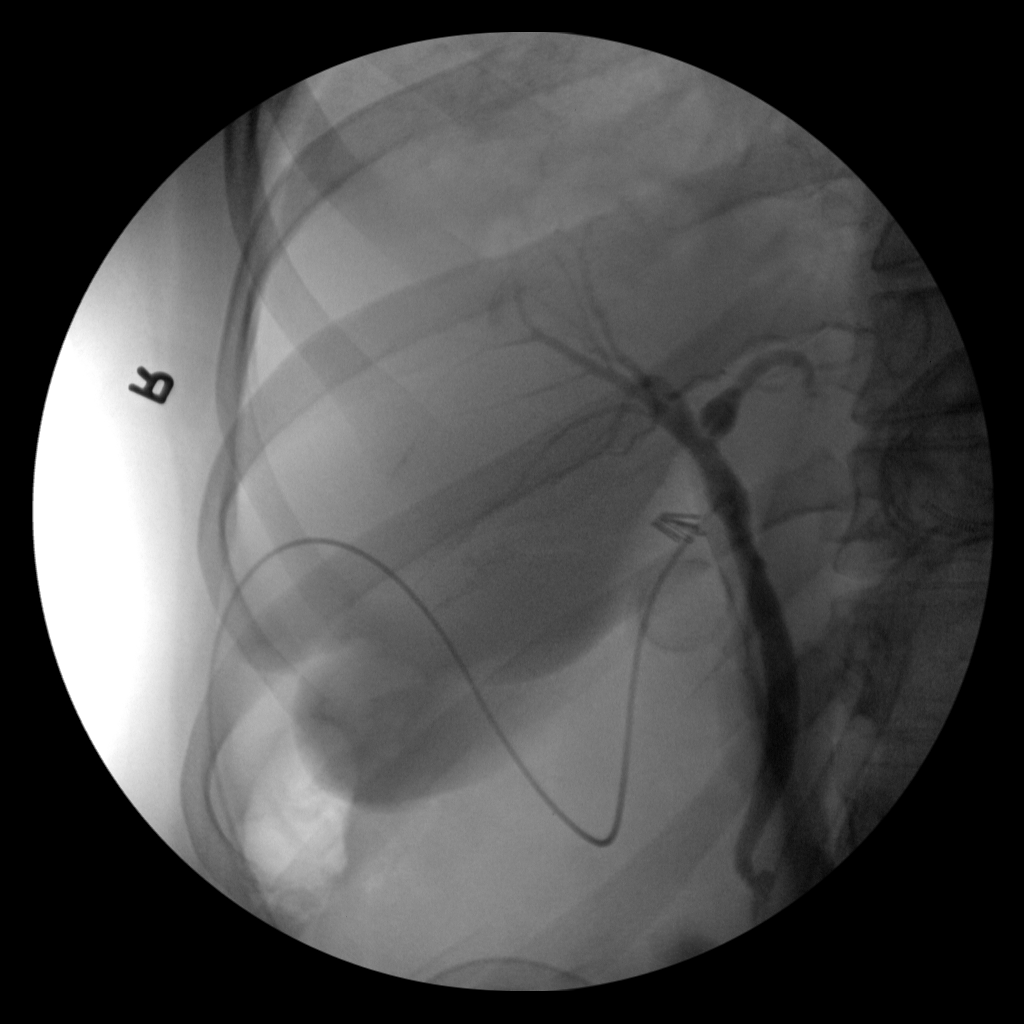
[im 1/2]
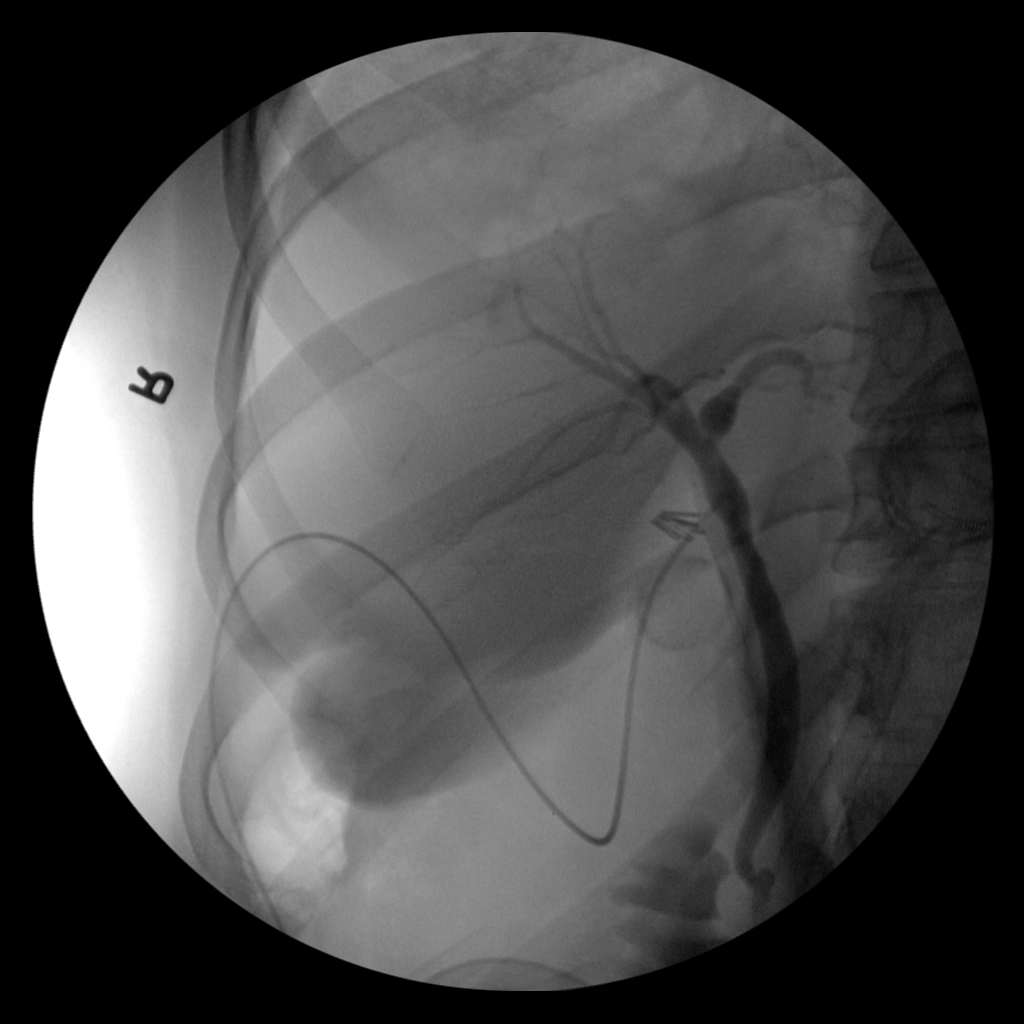
[im 2/2]
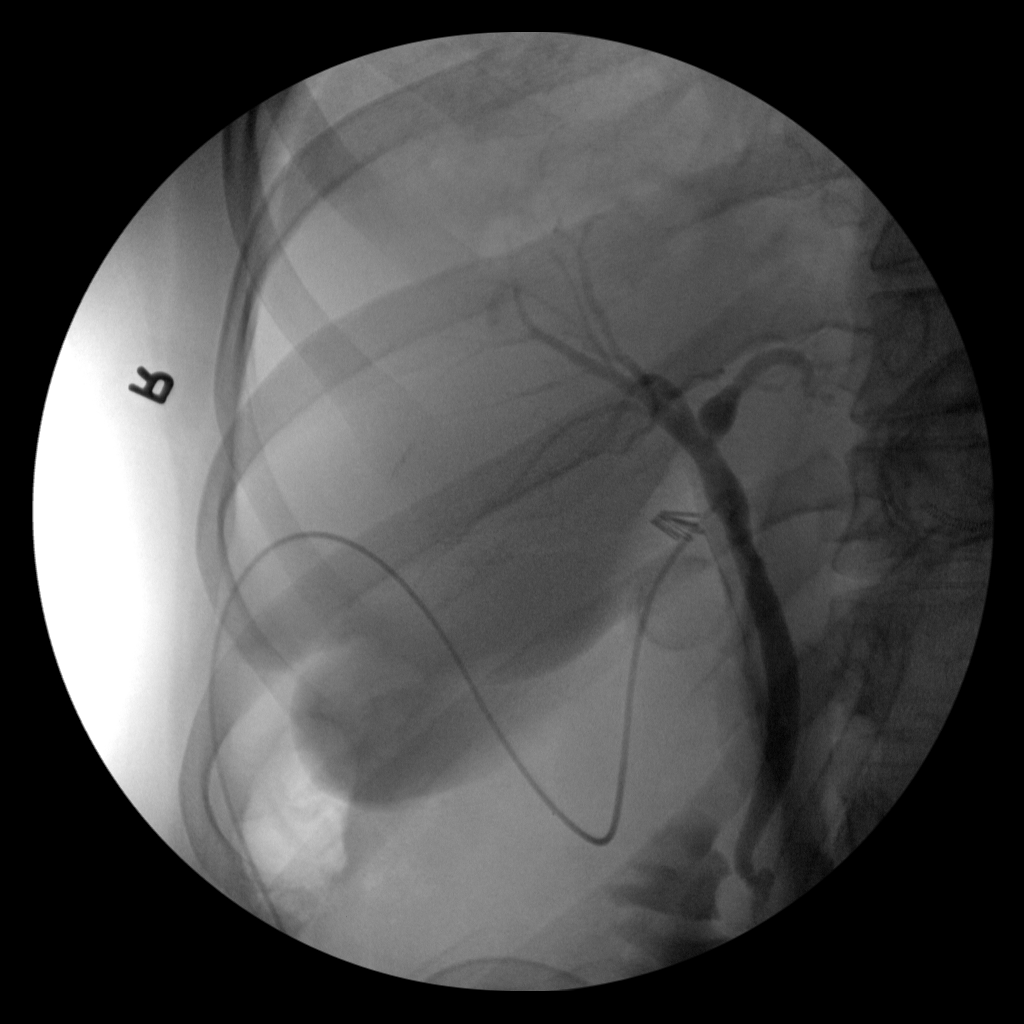

[5 of 5 positions shown; findings below may reference images not displayed]

FINDINGS: No persistent filling defects in the common duct, the distal most
segment and ampulla incompletely visualized. Intrahepatic ducts are
incompletely visualized, appearing decompressed centrally. Contrast
passes into the duodenum.

:
No evidence of retained common duct stone.

## 2020-10-25 ENCOUNTER — Ambulatory Visit: Payer: Self-pay | Attending: Internal Medicine

## 2020-10-25 DIAGNOSIS — Z23 Encounter for immunization: Secondary | ICD-10-CM

## 2020-10-25 NOTE — Progress Notes (Signed)
   Covid-19 Vaccination Clinic  Name:  Michelle Davies    MRN: [id number] DOB: 11/22/1981  10/25/2020  Michelle Davies was observed post Covid-19 immunization for 15 minutes without incident. She was provided with Vaccine Information Sheet and instruction to access the V-Safe system.   Michelle Davies was instructed to call 911 with any severe reactions post vaccine: Marland Kitchen Difficulty breathing  . Swelling of face and throat  . A fast heartbeat  . A bad rash all over body  . Dizziness and weakness

## 2021-01-08 IMAGING — US US OB TRANSVAGINAL
1 series · 16 of 28 positions shown · non-contrast
Comparison: None.

CLINICAL DATA: Vaginal bleeding

EXAM:
TRANSVAGINAL OB ULTRASOUND
TECHNIQUE: Transvaginal ultrasound was performed for complete evaluation of the
gestation as well as the maternal uterus, adnexal regions, and
pelvic cul-de-sac.

[Series 1: us ob transvaginal · 29 acquisitions, 16 frames shown]
[im 1/29]
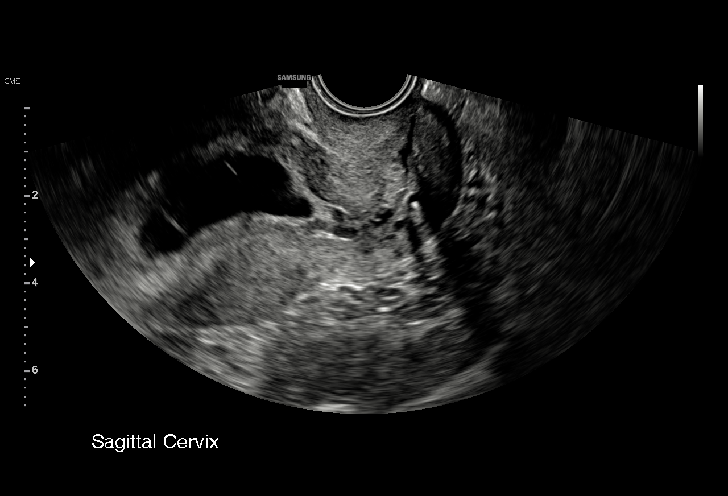
[im 3/29]
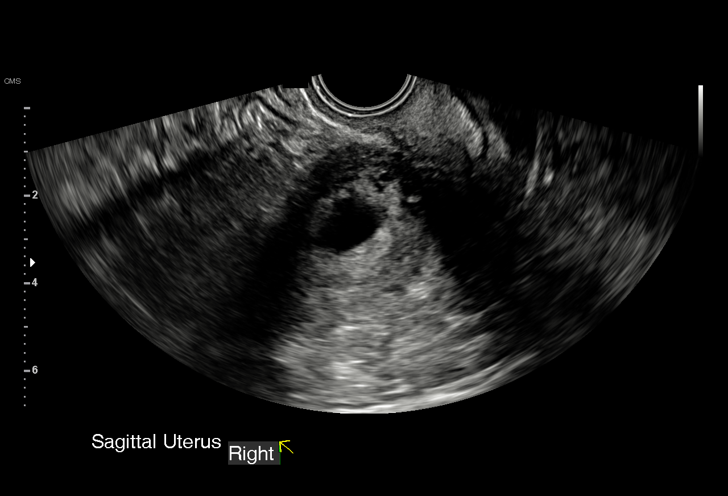
[im 5/29]
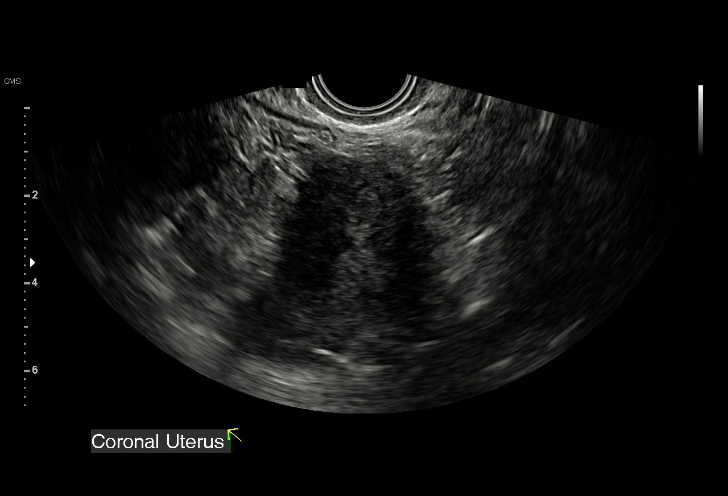
[im 7/29]
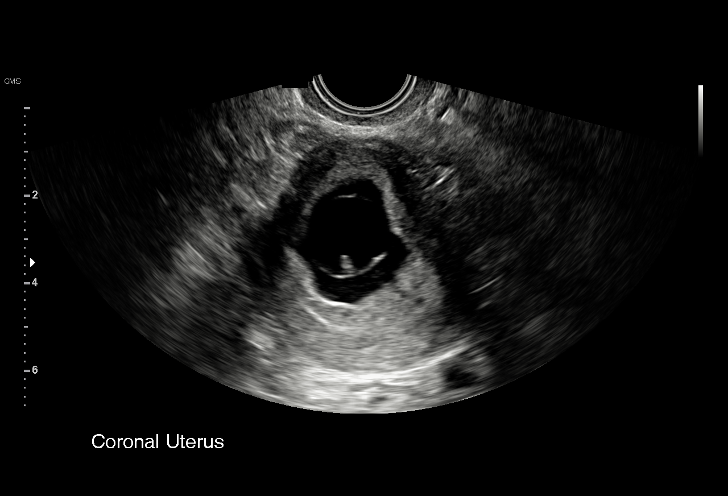
[im 8/29]
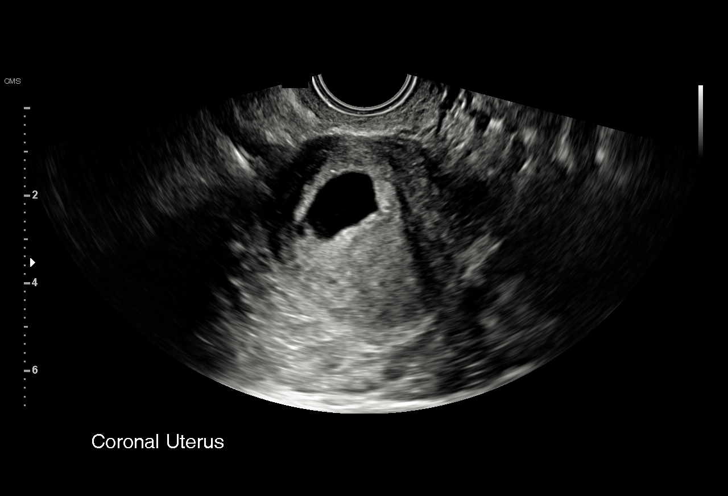
[im 10/29]
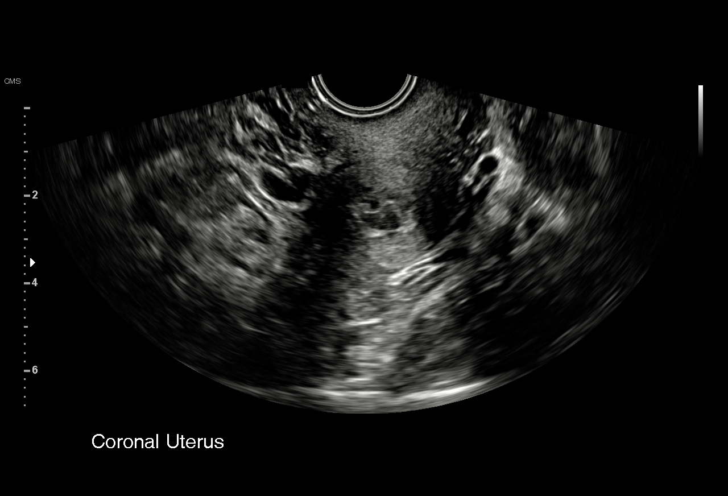
[im 12/29]
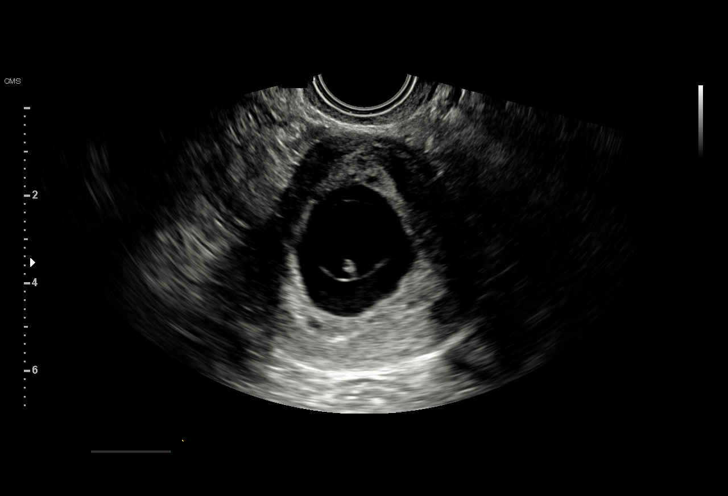
[im 14/29]
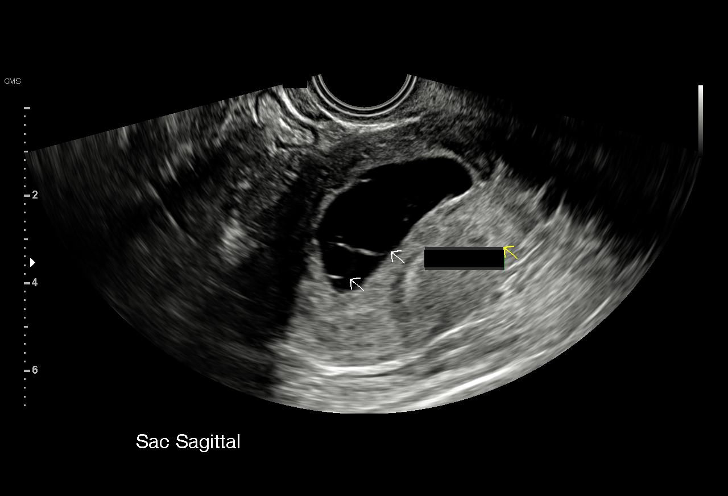
[im 15/29]
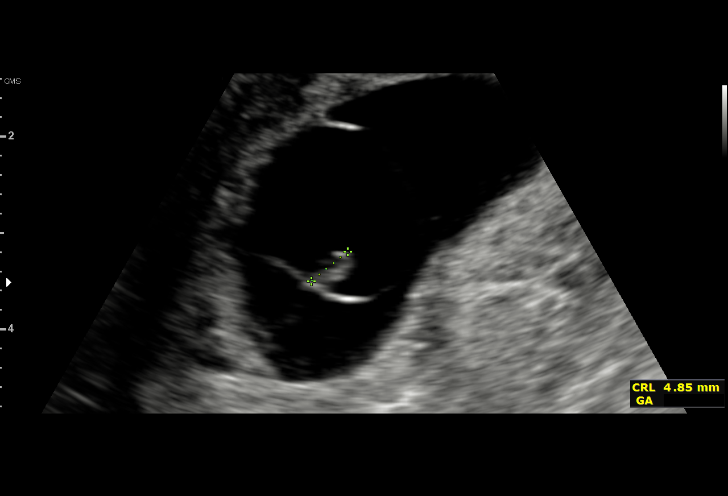
[im 17/29]
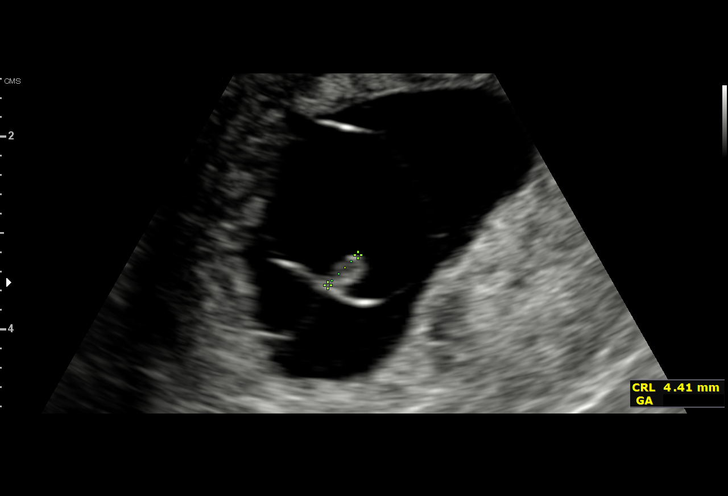
[im 19/29]
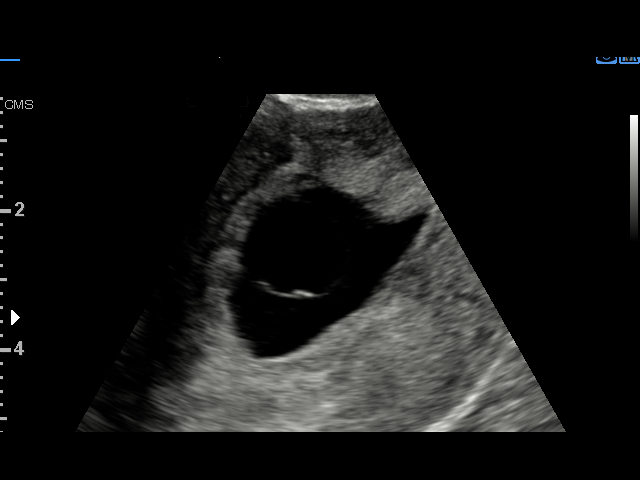
[im 21/29]
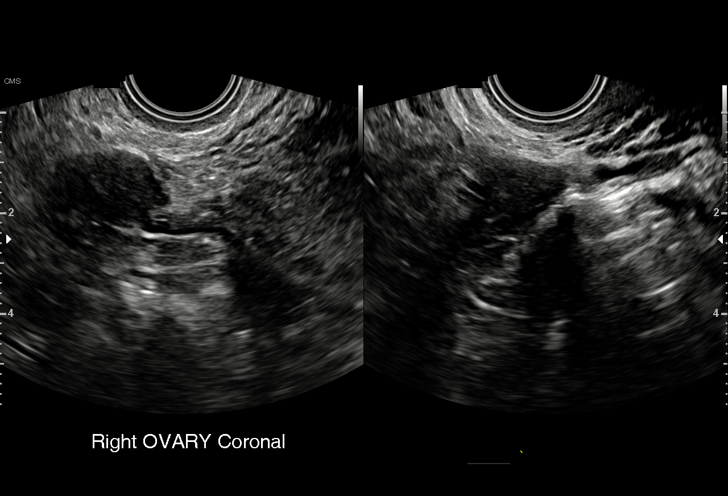
[im 22/29]
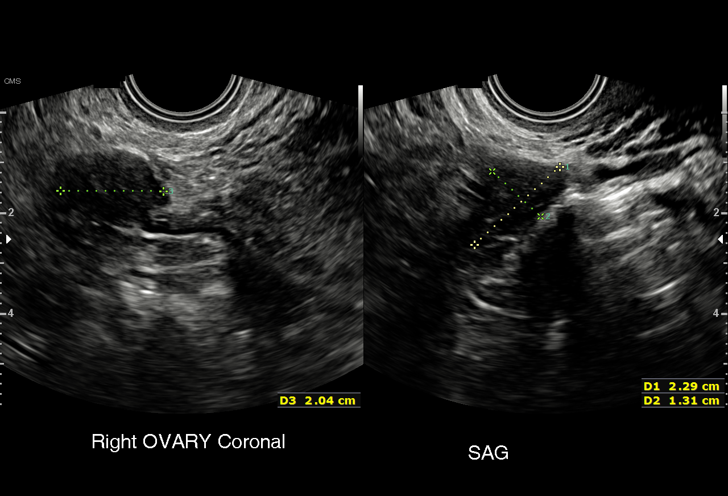
[im 24/29]
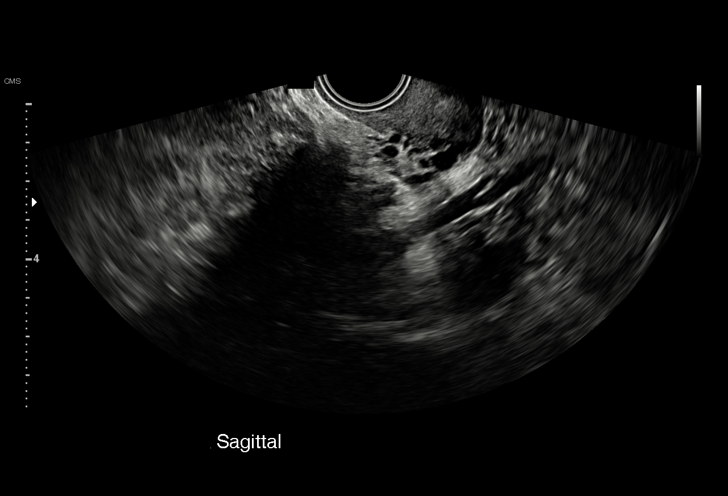
[im 26/29]
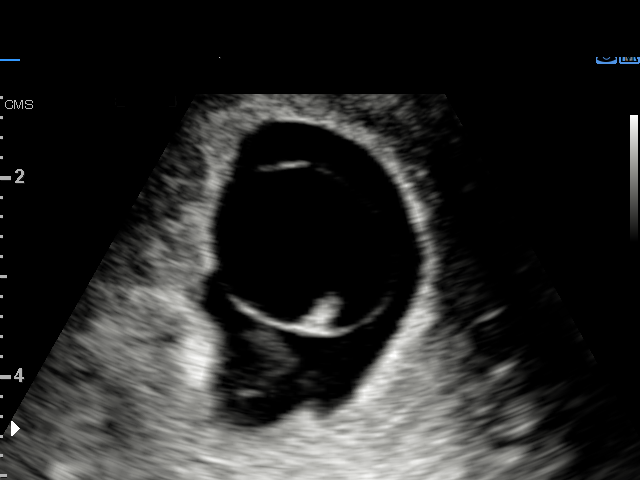
[im 29/29]
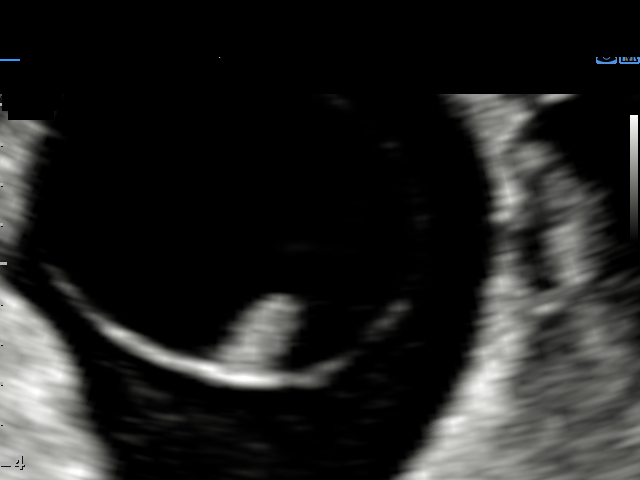

[16 of 28 positions shown; findings below may reference images not displayed]

FINDINGS: Intrauterine gestational sac: Single

Yolk sac:  Visualized

Embryo:  Visualized

Cardiac Activity: Not visualized

Heart Rate:  bpm

MSD:   mm    w     d

CRL:   4.9 mm   6 w 1 d                  US EDC: 11/30/2020

Subchorionic hemorrhage:  None visualized.

Maternal uterus/adnexae: No adnexal mass or free fluid.
IMPRESSION: Six week 1 day intrauterine pregnancy. Fetal cardiac activity not
detected at this time. Recommend follow-up ultrasound in 10-14 days
to ensure expected progression.

No acute maternal findings.

## 2021-01-11 IMAGING — US US OB TRANSVAGINAL
1 series · 15 of 28 positions shown · non-contrast
Comparison: Ultrasound 04/07/2020

CLINICAL DATA: Follow-up bleeding

EXAM:
TRANSVAGINAL OB ULTRASOUND
TECHNIQUE: Transvaginal ultrasound was performed for complete evaluation of the
gestation as well as the maternal uterus, adnexal regions, and
pelvic cul-de-sac.

[Series 1: us ob transvaginal · 15 of 31 slices shown]
[im 1/31]
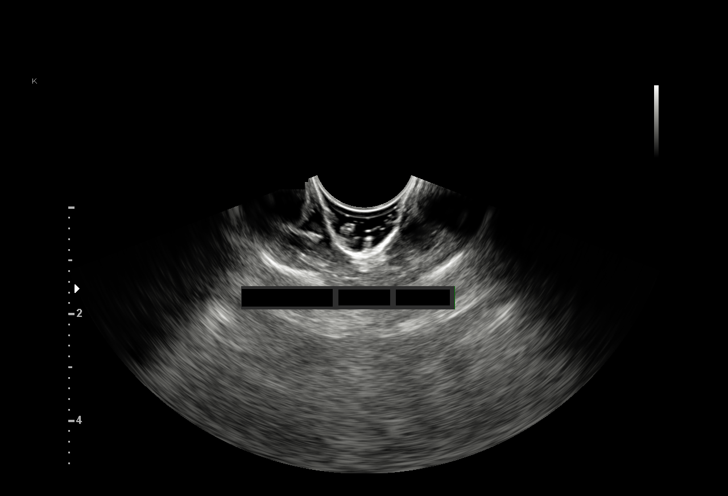
[im 3/31]
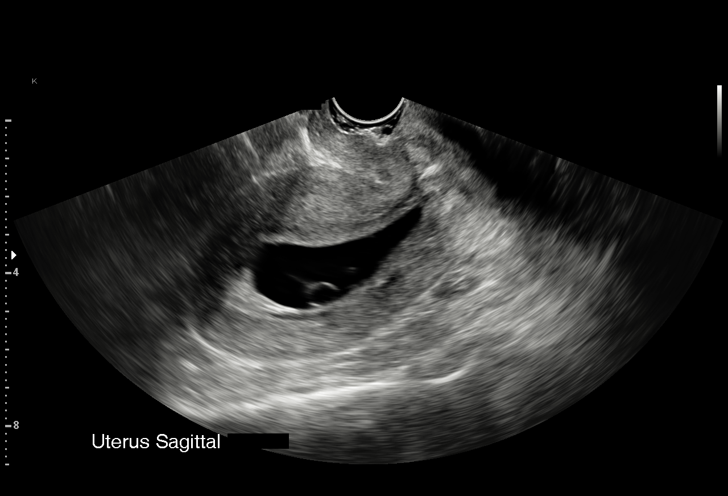
[im 5/31]
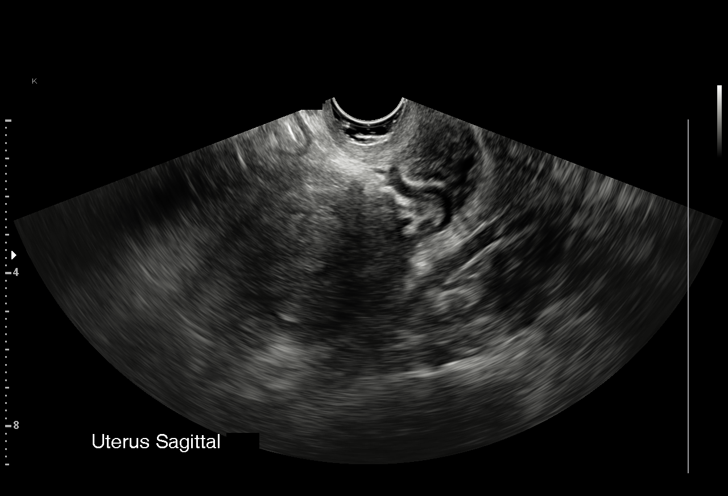
[im 7/31]
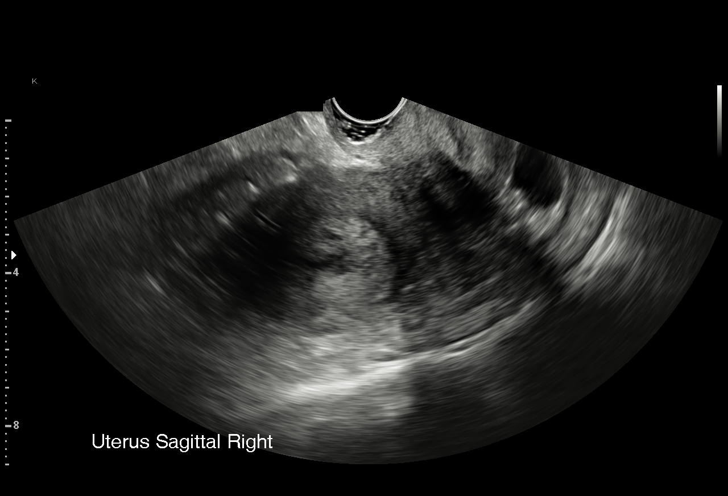
[im 9/31]
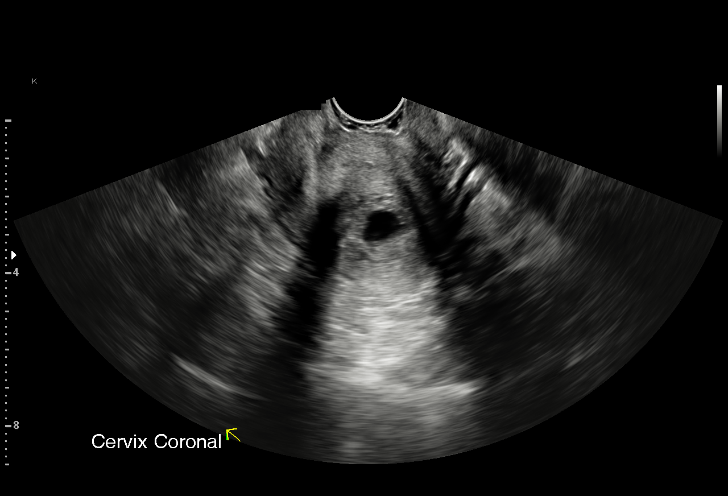
[im 12/31]
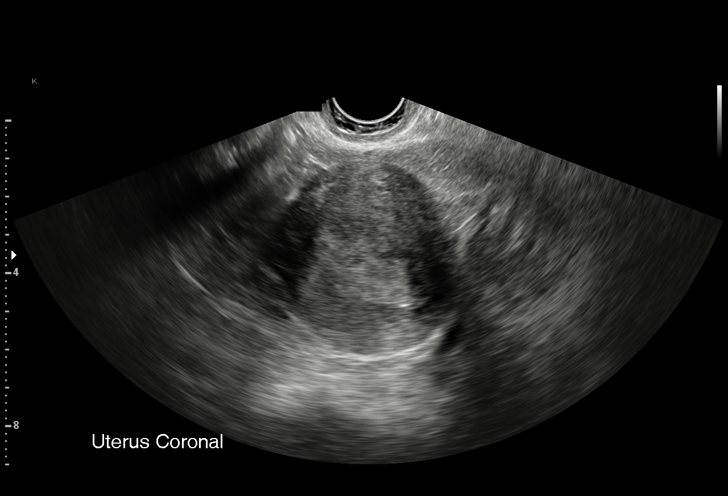
[im 14/31]
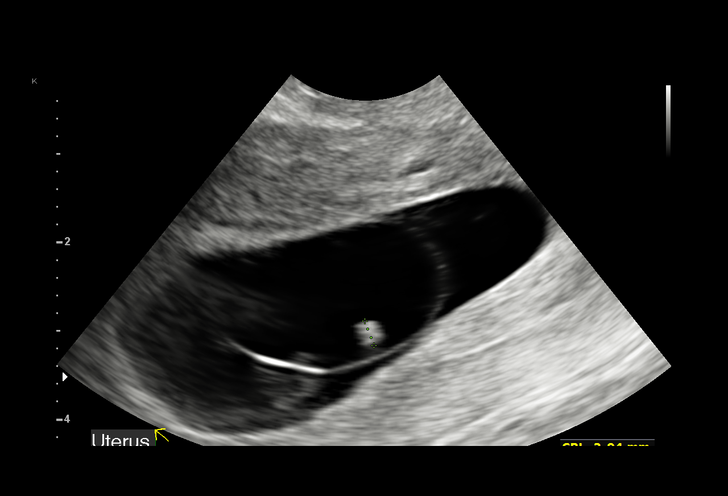
[im 16/31]
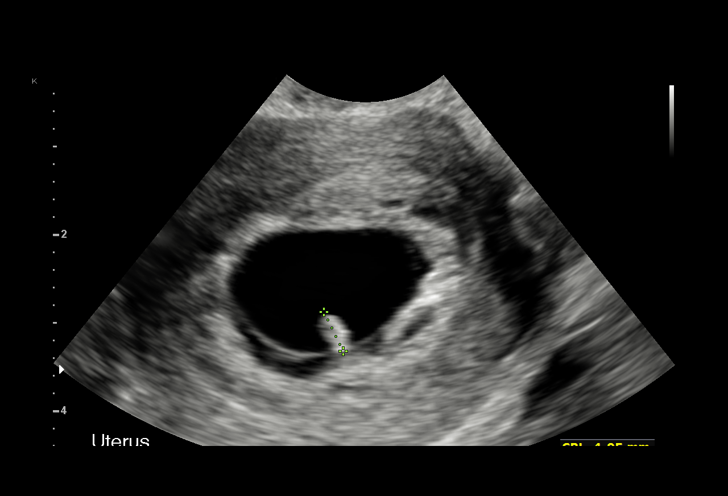
[im 17/31]
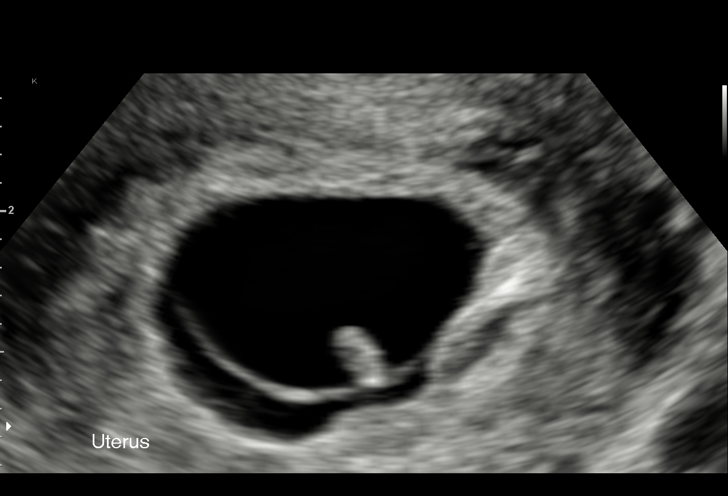
[im 19/31]
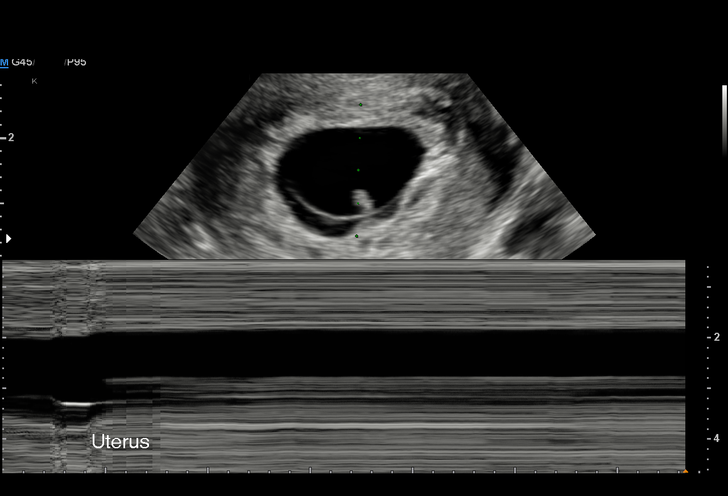
[im 22/31]
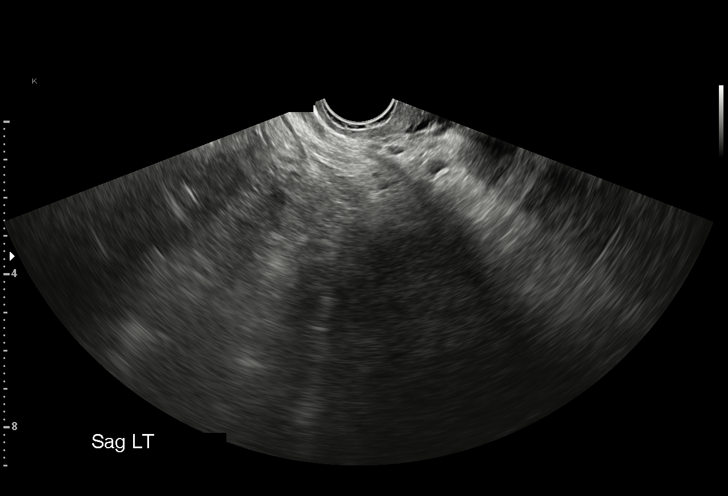
[im 24/31]
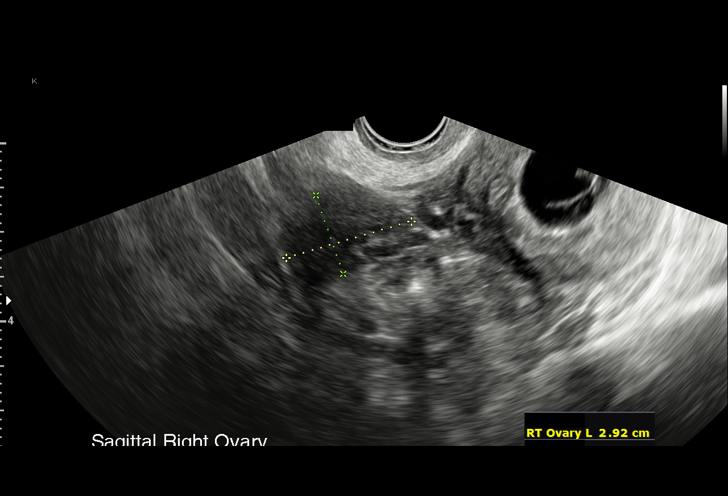
[im 26/31]
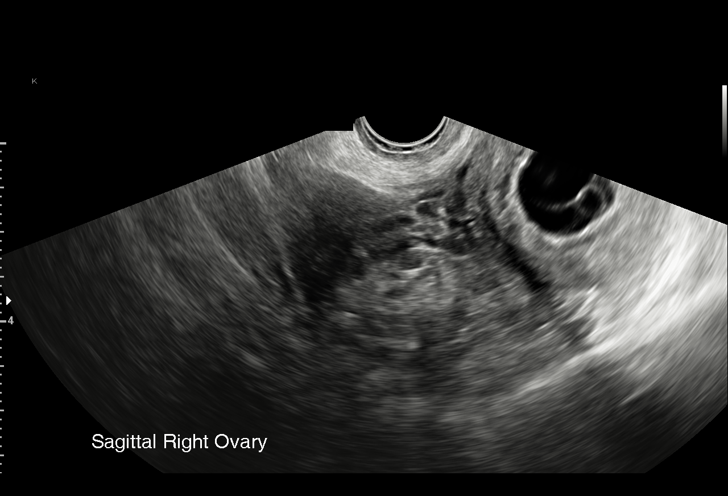
[im 28/31]
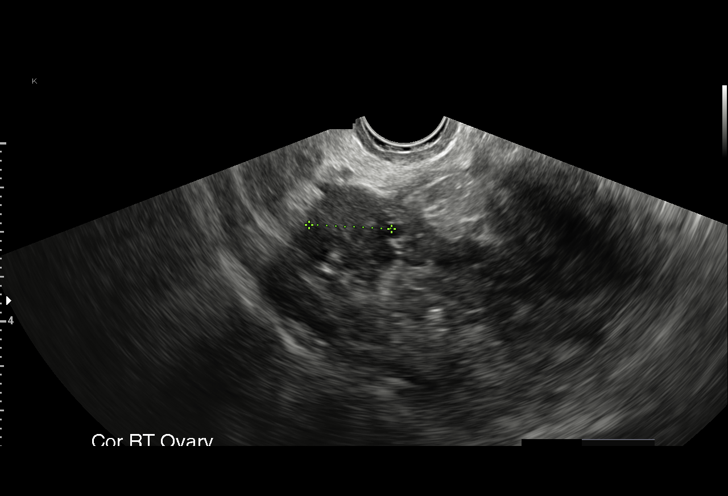
[im 31/31]
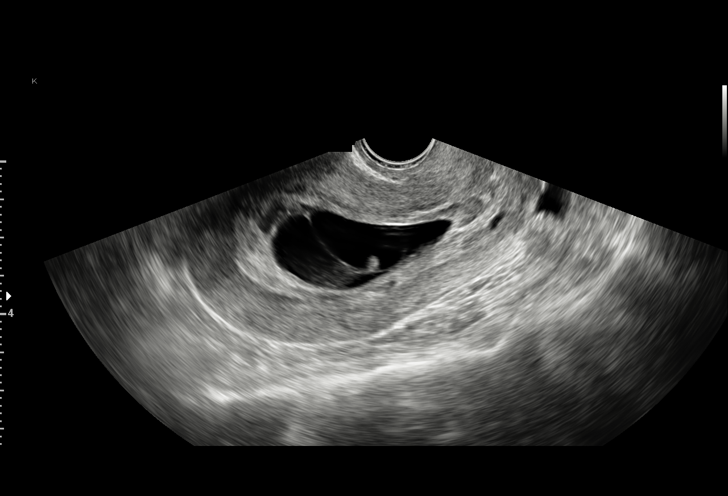

[15 of 28 positions shown; findings below may reference images not displayed]

FINDINGS: Intrauterine gestational sac: Single

Yolk sac:  Visualized

Embryo:  Visualized

Cardiac Activity: Not visualized

CRL: 4.1 mm   6 w 1 d                  US EDC: 12/03/2020

Subchorionic hemorrhage:  None visualized.

Maternal uterus/adnexae: Left ovary not evaluated. The right ovary
is normal and measures 1.9 by 2.9 x 1.9 cm.
IMPRESSION: Single IUP with visible gestational sac, yolk sac and embryo but no
fetal cardiac activity. Findings are suspicious but not yet
definitive for failed pregnancy. Recommend follow-up US in 10-14
days from the ultrasound dated 04/07/2020 for definitive diagnosis.
This recommendation follows SRU consensus guidelines: Diagnostic
Criteria for Nonviable Pregnancy Early in the First Trimester. N
Engl J Med 2927; [DATE].

## 2021-12-27 NOTE — L&D Delivery Note (Signed)
DELIVERY NOTE  Pt complete and at +2 station with urge to push. Epidural controlling pain. Pt pushed and delivered a viable female infant in ROA position. Loos nuchal x1, delivered through. Anterior and posterior shoulders spontaneously delivered with next two pushes; body easily followed next. Infant placed on mothers abdomen and bulb suction of mouth and nose performed. Cord was then clamped and cut by FOB. Cord blood obtained, 3VC. Baby had a vigorous spontaneous cry noted. Placenta then delivered at 0707 intact. Fundal massage performed and pitocin per protocol. Fundus firm. The following lacerations were noted: 2nd degree, periurethral abrasion. Former repaired in routine fashion with 2-0 vicryl, EBL 300cc. Mother and baby stable. Counts correct   Infant time: 0702 Gender: female Placenta time: 0707 Apgars: 9/9 Weight: pending skin-to-skin

## 2021-12-28 LAB — HEPATITIS C ANTIBODY: HCV Ab: NEGATIVE

## 2021-12-28 LAB — OB RESULTS CONSOLE GC/CHLAMYDIA
Chlamydia: NEGATIVE
Neisseria Gonorrhea: NEGATIVE

## 2021-12-28 LAB — OB RESULTS CONSOLE HIV ANTIBODY (ROUTINE TESTING): HIV: NONREACTIVE

## 2021-12-28 LAB — OB RESULTS CONSOLE ABO/RH: RH Type: POSITIVE

## 2021-12-28 LAB — OB RESULTS CONSOLE RPR: RPR: NONREACTIVE

## 2021-12-28 LAB — OB RESULTS CONSOLE HEPATITIS B SURFACE ANTIGEN: Hepatitis B Surface Ag: NEGATIVE

## 2021-12-28 LAB — OB RESULTS CONSOLE ANTIBODY SCREEN: Antibody Screen: NEGATIVE

## 2022-07-01 LAB — OB RESULTS CONSOLE GBS: GBS: NEGATIVE

## 2022-07-20 ENCOUNTER — Telehealth (HOSPITAL_COMMUNITY): Payer: Self-pay | Admitting: *Deleted

## 2022-07-20 ENCOUNTER — Encounter (HOSPITAL_COMMUNITY): Payer: Self-pay | Admitting: *Deleted

## 2022-07-20 NOTE — Telephone Encounter (Signed)
Preadmission screen  

## 2022-07-21 ENCOUNTER — Encounter (HOSPITAL_COMMUNITY): Payer: Self-pay

## 2022-07-22 ENCOUNTER — Telehealth (HOSPITAL_COMMUNITY): Payer: Self-pay | Admitting: *Deleted

## 2022-07-22 NOTE — Telephone Encounter (Signed)
Preadmission screen  

## 2022-07-23 ENCOUNTER — Encounter (HOSPITAL_COMMUNITY): Payer: Self-pay | Admitting: *Deleted

## 2022-07-23 ENCOUNTER — Telehealth (HOSPITAL_COMMUNITY): Payer: Self-pay | Admitting: *Deleted

## 2022-07-23 NOTE — Telephone Encounter (Signed)
Preadmission screen  

## 2022-07-26 ENCOUNTER — Encounter (HOSPITAL_COMMUNITY): Payer: Self-pay | Admitting: Obstetrics and Gynecology

## 2022-07-26 ENCOUNTER — Inpatient Hospital Stay (HOSPITAL_COMMUNITY)
Admission: AD | Admit: 2022-07-26 | Discharge: 2022-07-29 | DRG: 807 | Disposition: A | Discharge status: Home / Self Care | Payer: No Typology Code available for payment source | Attending: Obstetrics and Gynecology | Admitting: Obstetrics and Gynecology

## 2022-07-26 ENCOUNTER — Inpatient Hospital Stay (HOSPITAL_COMMUNITY): Payer: No Typology Code available for payment source | Admitting: Anesthesiology

## 2022-07-26 ENCOUNTER — Other Ambulatory Visit: Payer: Self-pay

## 2022-07-26 DIAGNOSIS — Z3A40 40 weeks gestation of pregnancy: Secondary | ICD-10-CM

## 2022-07-26 DIAGNOSIS — O480 Post-term pregnancy: Principal | ICD-10-CM | POA: Diagnosis present

## 2022-07-26 DIAGNOSIS — Z87891 Personal history of nicotine dependence: Secondary | ICD-10-CM | POA: Diagnosis not present

## 2022-07-26 DIAGNOSIS — O26893 Other specified pregnancy related conditions, third trimester: Secondary | ICD-10-CM | POA: Diagnosis present

## 2022-07-26 DIAGNOSIS — O4292 Full-term premature rupture of membranes, unspecified as to length of time between rupture and onset of labor: Principal | ICD-10-CM

## 2022-07-26 LAB — HEPATITIS B SURFACE ANTIGEN: Hepatitis B Surface Ag: NONREACTIVE

## 2022-07-26 LAB — CBC
HCT: 35.5 % — ABNORMAL LOW (ref 36.0–46.0)
Hemoglobin: 11.9 g/dL — ABNORMAL LOW (ref 12.0–15.0)
MCH: 32.2 pg (ref 26.0–34.0)
MCHC: 33.5 g/dL (ref 30.0–36.0)
MCV: 96.2 fL (ref 80.0–100.0)
Platelets: 281 10*3/uL (ref 150–400)
RBC: 3.69 MIL/uL — ABNORMAL LOW (ref 3.87–5.11)
RDW: 14.1 % (ref 11.5–15.5)
WBC: 11.6 10*3/uL — ABNORMAL HIGH (ref 4.0–10.5)
nRBC: 0 % (ref 0.0–0.2)

## 2022-07-26 LAB — TYPE AND SCREEN
ABO/RH(D): O POS
Antibody Screen: NEGATIVE

## 2022-07-26 LAB — POCT FERN TEST: POCT Fern Test: POSITIVE

## 2022-07-26 MED ORDER — FENTANYL-BUPIVACAINE-NACL 0.5-0.125-0.9 MG/250ML-% EP SOLN
EPIDURAL | Status: AC
  Filled 2022-07-26: qty 250

## 2022-07-26 MED ORDER — ACETAMINOPHEN 325 MG PO TABS
650.0000 mg | ORAL_TABLET | ORAL | Status: DC | PRN
  Administered 2022-07-27: 650 mg via ORAL
  Filled 2022-07-26: qty 2

## 2022-07-26 MED ORDER — FENTANYL-BUPIVACAINE-NACL 0.5-0.125-0.9 MG/250ML-% EP SOLN
12.0000 mL/h | EPIDURAL | Status: DC | PRN
  Administered 2022-07-26: 12 mL/h via EPIDURAL

## 2022-07-26 MED ORDER — EPHEDRINE 5 MG/ML INJ: 10.0000 mg | INTRAVENOUS | Status: DC | PRN

## 2022-07-26 MED ORDER — LACTATED RINGERS IV SOLN
500.0000 mL | Freq: Once | INTRAVENOUS | Status: AC
  Administered 2022-07-26: 500 mL via INTRAVENOUS

## 2022-07-26 MED ORDER — PHENYLEPHRINE 80 MCG/ML (10ML) SYRINGE FOR IV PUSH (FOR BLOOD PRESSURE SUPPORT): 80.0000 ug | PREFILLED_SYRINGE | INTRAVENOUS | Status: DC | PRN

## 2022-07-26 MED ORDER — OXYTOCIN-SODIUM CHLORIDE 30-0.9 UT/500ML-% IV SOLN
2.5000 [IU]/h | INTRAVENOUS | Status: DC
  Administered 2022-07-27: 2.5 [IU]/h via INTRAVENOUS

## 2022-07-26 MED ORDER — DIPHENHYDRAMINE HCL 50 MG/ML IJ SOLN: 12.5000 mg | INTRAMUSCULAR | Status: DC | PRN

## 2022-07-26 MED ORDER — SOD CITRATE-CITRIC ACID 500-334 MG/5ML PO SOLN: 30.0000 mL | ORAL | Status: DC | PRN

## 2022-07-26 MED ORDER — FLEET ENEMA 7-19 GM/118ML RE ENEM: 1.0000 | ENEMA | RECTAL | Status: DC | PRN

## 2022-07-26 MED ORDER — LIDOCAINE HCL (PF) 1 % IJ SOLN
INTRAMUSCULAR | Status: DC | PRN
  Administered 2022-07-26: 10 mL via EPIDURAL

## 2022-07-26 MED ORDER — OXYCODONE-ACETAMINOPHEN 5-325 MG PO TABS: 1.0000 | ORAL_TABLET | ORAL | Status: DC | PRN

## 2022-07-26 MED ORDER — ONDANSETRON HCL 4 MG/2ML IJ SOLN
4.0000 mg | Freq: Four times a day (QID) | INTRAMUSCULAR | Status: DC | PRN
  Administered 2022-07-27: 4 mg via INTRAVENOUS
  Filled 2022-07-26: qty 2

## 2022-07-26 MED ORDER — LACTATED RINGERS IV SOLN
INTRAVENOUS | Status: DC
  Administered 2022-07-26: 125 mL/h via INTRAVENOUS

## 2022-07-26 MED ORDER — OXYCODONE-ACETAMINOPHEN 5-325 MG PO TABS: 2.0000 | ORAL_TABLET | ORAL | Status: DC | PRN

## 2022-07-26 MED ORDER — OXYTOCIN-SODIUM CHLORIDE 30-0.9 UT/500ML-% IV SOLN
INTRAVENOUS | Status: AC
  Administered 2022-07-27: 333 mL via INTRAVENOUS
  Filled 2022-07-26: qty 500

## 2022-07-26 MED ORDER — OXYTOCIN BOLUS FROM INFUSION: 333.0000 mL | Freq: Once | INTRAVENOUS | Status: AC

## 2022-07-26 MED ORDER — EPHEDRINE 5 MG/ML INJ
10.0000 mg | INTRAVENOUS | Status: DC | PRN
  Filled 2022-07-26: qty 5

## 2022-07-26 MED ORDER — OXYTOCIN-SODIUM CHLORIDE 30-0.9 UT/500ML-% IV SOLN
1.0000 m[IU]/min | INTRAVENOUS | Status: DC
  Administered 2022-07-26: 2 m[IU]/min via INTRAVENOUS

## 2022-07-26 MED ORDER — LACTATED RINGERS IV SOLN: 500.0000 mL | INTRAVENOUS | Status: DC | PRN

## 2022-07-26 MED ORDER — TERBUTALINE SULFATE 1 MG/ML IJ SOLN: 0.2500 mg | Freq: Once | INTRAMUSCULAR | Status: DC | PRN

## 2022-07-26 MED ORDER — LIDOCAINE HCL (PF) 1 % IJ SOLN: 30.0000 mL | INTRAMUSCULAR | Status: DC | PRN

## 2022-07-26 NOTE — Progress Notes (Signed)
Patient now in bed 9. CE unchanged. Pitocin protocol ordered

## 2022-07-26 NOTE — H&P (Signed)
Amar Keenum is a 41 y.o. female presenting for possible ROM. Membranes stripped at last OV< +FM, denies VB. Irr ctx only. LOF at 0900, clear  PNC c/b 1) AMA - low risk NIPT 2) LLP - resolved by 32wks 3) BMI 38  GBS neg OB History     Gravida  3   Para  0   Term  0   Preterm  0   AB  2   Living  0      SAB  2   IAB  0   Ectopic  0   Multiple  0   Live Births  0          Past Medical History:  Diagnosis Date   Alopecia areata    Gall stones    IBS (irritable bowel syndrome)    Pneumonia    walking pneumonia   Past Surgical History:  Procedure Laterality Date   CHOLECYSTECTOMY N/A 11/23/2019   Procedure: LAPAROSCOPIC CHOLECYSTECTOMY WITH INTRAOPERATIVE CHOLANGIOGRAM;  Surgeon: Manus Rudd, MD;  Location: [facility] OR;  Service: General;  Laterality: N/A;   WISDOM TOOTH EXTRACTION     Family History: family history includes Arthritis in her maternal grandmother and mother; Parkinson's disease in her paternal grandmother. Social History:  reports that she quit smoking about 7 months ago. Her smoking use included cigarettes. She smoked an average of .25 packs per day. She has never used smokeless tobacco. She reports that she does not currently use alcohol. She reports that she does not currently use drugs.     Maternal Diabetes: No 1hr 161, 3hr 0 of 4 Genetic Screening: Normal Maternal Ultrasounds/Referrals: Normal Fetal Ultrasounds or other Referrals:  None Maternal Substance Abuse:  No Significant Maternal Medications:  None Significant Maternal Lab Results:  Group B Strep negative Number of Prenatal Visits:greater than 3 verified prenatal visits Other Comments:  None  Review of Systems  Constitutional:  Negative for chills and fever.  Respiratory:  Negative for shortness of breath.   Cardiovascular:  Negative for chest pain, palpitations and leg swelling.  Gastrointestinal:  Negative for abdominal pain and vomiting.  Genitourinary:  Positive  for vaginal discharge.  Neurological:  Negative for dizziness, weakness and headaches.  Psychiatric/Behavioral:  Negative for suicidal ideas.    Maternal Medical History:  Reason for admission: Rupture of membranes.   Contractions: Onset was 1-2 hours ago.     Dilation: 2 Effacement (%): 50 Station: -3 Exam by:: Cleone Slim, CNM Blood pressure (!) 92/45, pulse 80, temperature 97.7 F (36.5 C), temperature source Oral, resp. rate 14, height 5\' 6"  (1.676 m), weight 106.9 kg, SpO2 99 %, unknown if currently breastfeeding. Exam Physical Exam Constitutional:      General: She is not in acute distress.    Appearance: She is well-developed.  HENT:     Head: Normocephalic and atraumatic.  Eyes:     Pupils: Pupils are equal, round, and reactive to light.  Cardiovascular:     Rate and Rhythm: Normal rate and regular rhythm.     Heart sounds: No murmur heard.    No gallop.  Abdominal:     Tenderness: There is no abdominal tenderness. There is no guarding or rebound.  Genitourinary:    Vagina: Normal.  Musculoskeletal:        General: Normal range of motion.     Cervical back: Normal range of motion and neck supple.  Skin:    General: Skin is warm and dry.  Neurological:  Mental Status: She is alert and oriented to person, place, and time.     Prenatal labs: ABO, Rh: --/--/O POS (07/31 1140) Antibody: NEG (07/31 1140) Rubella:   RPR: Nonreactive (01/02 0000)  HBsAg: NON REACTIVE (07/31 1142)  HIV: Non-reactive (01/02 0000)  GBS: Negative/-- (07/06 0000)  Cat 1 tracing TOCO irr  Assessment/Plan: This ia 41yo G3P0020 @ 40 3/7 by LMP c.w TVUS presenting with confirmed ROM (ferm pos, visible extrusion, pooling). CE 2/50/-3, vtx confirmed. Patient presented and admitted early around 1000 however short staffing on L&D means patient is currently being held in MAU. Irr cramping only, cannot being augmentation in MAU. Afebrile, GBS neg, reassess CE and augment with pitocin as  needed once on L&D floor   Carlisle Cater 07/26/2022, 4:32 PM

## 2022-07-26 NOTE — Anesthesia Procedure Notes (Signed)
Epidural Patient location during procedure: OB Start time: 07/26/2022 10:38 PM End time: 07/26/2022 10:42 PM  Staffing Anesthesiologist: Leilani Able, MD Performed: anesthesiologist   Preanesthetic Checklist Completed: patient identified, IV checked, site marked, risks and benefits discussed, surgical consent, monitors and equipment checked, pre-op evaluation and timeout performed  Epidural Patient position: sitting Prep: DuraPrep and site prepped and draped Patient monitoring: continuous pulse ox and blood pressure Approach: midline Location: L3-L4 Injection technique: LOR air  Needle:  Needle type: Tuohy  Needle gauge: 17 G Needle length: 9 cm and 9 Needle insertion depth: 7 cm Catheter type: closed end flexible Catheter size: 19 Gauge Catheter at skin depth: 12 cm Test dose: negative and Other  Assessment Events: blood not aspirated, injection not painful, no injection resistance, no paresthesia and negative IV test  Additional Notes Reason for block:procedure for pain

## 2022-07-26 NOTE — Plan of Care (Signed)

## 2022-07-26 NOTE — Progress Notes (Signed)
Some cramping BP 116/79   Pulse 91   Temp 99 F (37.2 C) (Oral)   Resp 16   Ht 5\' 6"  (1.676 m)   Wt 106.6 kg   SpO2 99% Comment: room air  BMI 37.93 kg/m  CE 3/90/-2 Pitocin at 10 mU/min, TOCO q3-57m Cat 1 tracing  Continue to titrate pitocin, epidural when desired

## 2022-07-26 NOTE — Anesthesia Preprocedure Evaluation (Signed)
Anesthesia Evaluation  Patient identified by MRN, date of birth, ID band Patient awake    Reviewed: Allergy & Precautions, H&P , Patient's Chart, lab work & pertinent test results  Airway Mallampati: II       Dental no notable dental hx.    Pulmonary Patient abstained from smoking., former smoker,    Pulmonary exam normal        Cardiovascular negative cardio ROS Normal cardiovascular exam     Neuro/Psych negative neurological ROS  negative psych ROS   GI/Hepatic negative GI ROS, Neg liver ROS,   Endo/Other  negative endocrine ROS  Renal/GU negative Renal ROS  negative genitourinary   Musculoskeletal negative musculoskeletal ROS (+)   Abdominal (+) + obese,   Peds  Hematology  (+) Blood dyscrasia, anemia ,   Anesthesia Other Findings   Reproductive/Obstetrics (+) Pregnancy                             Anesthesia Physical Anesthesia Plan  ASA: 2  Anesthesia Plan: Epidural   Post-op Pain Management:    Induction:   PONV Risk Score and Plan:   Airway Management Planned:   Additional Equipment:   Intra-op Plan:   Post-operative Plan:   Informed Consent: I have reviewed the patients History and Physical, chart, labs and discussed the procedure including the risks, benefits and alternatives for the proposed anesthesia with the patient or authorized representative who has indicated his/her understanding and acceptance.       Plan Discussed with:   Anesthesia Plan Comments:         Anesthesia Quick Evaluation

## 2022-07-26 NOTE — MAU Provider Note (Signed)
S: Ms. Michelle Davies is a 41 y.o. G3P0020 at [redacted]w[redacted]d  who presents to MAU today complaining of leaking of fluid since 0900. She denies vaginal bleeding. She denies contractions. She reports normal fetal movement.    O: BP 110/73 (BP Location: Right Arm)   Pulse 97   Temp 98 F (36.7 C) (Oral)   Resp 16   Ht 5\' 6"  (1.676 m)   Wt 106.9 kg   SpO2 99% Comment: room air  BMI 38.04 kg/m  GENERAL: Well-developed, well-nourished female in no acute distress.  HEAD: Normocephalic, atraumatic.  CHEST: Normal effort of breathing, regular heart rate ABDOMEN: Soft, nontender, gravid PELVIC: Normal external female genitalia. Vagina is pink and rugated. Cervix with normal contour, no lesions. Small amount of clear watery discharge trickling from cervix  Cervical exam:   2/50/-3   Fetal Monitoring: Baseline: 125 Variability: moderate Accelerations: 15x15 Decelerations: none Contractions: none  Fern- positive  A: SIUP at [redacted]w[redacted]d  SROM  P: Report given to RN to contact MD on call for further instructions  [redacted]w[redacted]d, CNM 07/26/2022 11:22 AM

## 2022-07-26 NOTE — MAU Note (Signed)
Michelle Davies is a 41 y.o. at [redacted]w[redacted]d here in MAU reporting: about an hour ago felt a "strong trickle of fluid". Unsure if she has continued to leak. Fluid was clear. No recent IC. No bleeding. No contractions but is feeling pelvic pressure. +FM.  Onset of complaint: today  Pain score: 0/10  Vitals:   07/26/22 1055  BP: 110/73  Pulse: 97  Resp: 16  Temp: 98 F (36.7 C)  SpO2: 99%     FHT:+FM  Lab orders placed from triage: none

## 2022-07-27 ENCOUNTER — Encounter (HOSPITAL_COMMUNITY): Payer: Self-pay | Admitting: Obstetrics and Gynecology

## 2022-07-27 LAB — RPR: RPR Ser Ql: NONREACTIVE

## 2022-07-27 LAB — RUBELLA SCREEN: Rubella: 13.5 index (ref >0.99)

## 2022-07-27 MED ORDER — BENZOCAINE-MENTHOL 20-0.5 % EX AERO
1.0000 | INHALATION_SPRAY | CUTANEOUS | Status: DC | PRN
  Administered 2022-07-27: 1 via TOPICAL
  Filled 2022-07-27: qty 56

## 2022-07-27 MED ORDER — SIMETHICONE 80 MG PO CHEW: 80.0000 mg | CHEWABLE_TABLET | ORAL | Status: DC | PRN

## 2022-07-27 MED ORDER — ACETAMINOPHEN 325 MG PO TABS
650.0000 mg | ORAL_TABLET | ORAL | Status: DC | PRN
  Administered 2022-07-27: 650 mg via ORAL
  Filled 2022-07-27: qty 2

## 2022-07-27 MED ORDER — SENNOSIDES-DOCUSATE SODIUM 8.6-50 MG PO TABS
2.0000 | ORAL_TABLET | Freq: Every day | ORAL | Status: DC
  Administered 2022-07-28 – 2022-07-29 (×2): 2 via ORAL
  Filled 2022-07-27 (×2): qty 2

## 2022-07-27 MED ORDER — ONDANSETRON HCL 4 MG PO TABS: 4.0000 mg | ORAL_TABLET | ORAL | Status: DC | PRN

## 2022-07-27 MED ORDER — ZOLPIDEM TARTRATE 5 MG PO TABS: 5.0000 mg | ORAL_TABLET | Freq: Every evening | ORAL | Status: DC | PRN

## 2022-07-27 MED ORDER — WITCH HAZEL-GLYCERIN EX PADS: 1.0000 | MEDICATED_PAD | CUTANEOUS | Status: DC | PRN

## 2022-07-27 MED ORDER — OXYCODONE HCL 5 MG PO TABS
5.0000 mg | ORAL_TABLET | ORAL | Status: DC | PRN
  Administered 2022-07-27 – 2022-07-28 (×5): 5 mg via ORAL
  Filled 2022-07-27 (×5): qty 1

## 2022-07-27 MED ORDER — COCONUT OIL OIL: 1.0000 | TOPICAL_OIL | Status: DC | PRN

## 2022-07-27 MED ORDER — PRENATAL MULTIVITAMIN CH
1.0000 | ORAL_TABLET | Freq: Every day | ORAL | Status: DC
  Administered 2022-07-27 – 2022-07-29 (×3): 1 via ORAL
  Filled 2022-07-27 (×3): qty 1

## 2022-07-27 MED ORDER — ONDANSETRON HCL 4 MG/2ML IJ SOLN: 4.0000 mg | INTRAMUSCULAR | Status: DC | PRN

## 2022-07-27 MED ORDER — DIPHENHYDRAMINE HCL 25 MG PO CAPS: 25.0000 mg | ORAL_CAPSULE | Freq: Four times a day (QID) | ORAL | Status: DC | PRN

## 2022-07-27 MED ORDER — IBUPROFEN 600 MG PO TABS
600.0000 mg | ORAL_TABLET | Freq: Four times a day (QID) | ORAL | Status: DC
  Administered 2022-07-27 – 2022-07-29 (×7): 600 mg via ORAL
  Filled 2022-07-27 (×9): qty 1

## 2022-07-27 MED ORDER — TETANUS-DIPHTH-ACELL PERTUSSIS 5-2.5-18.5 LF-MCG/0.5 IM SUSY: 0.5000 mL | PREFILLED_SYRINGE | Freq: Once | INTRAMUSCULAR | Status: DC

## 2022-07-27 MED ORDER — DIBUCAINE (PERIANAL) 1 % EX OINT: 1.0000 | TOPICAL_OINTMENT | CUTANEOUS | Status: DC | PRN

## 2022-07-27 NOTE — Lactation Note (Signed)
This note was copied from a baby's chart. Lactation Consultation Note  Patient Name: Girl Orma Cheetham Today's Date: 07/27/2022 Reason for consult: Initial assessment Age:40 hours Per Parent, infant has 4 episodes of emesis today.  Infant is using donor breast milk tonight see below. Per Birth parent,  she attempted to latch infant in L&D, infant did not latch, mom has a lot of areola edema. Mom is trying manage pain, RN is assisting mom with her pain management to get it under control. LC did not do teach hand expression due areola edema and  pain when touching breast, mom in a lot of pain at this time, unable to latch infant due to pain and discomfort. Birth Parent was given breast shells to wear in bra when not sleeping to help with areola edema. Birth Parent plans to attempt to use DEBP once feeling better, will attempt to pump on low setting if tolerable and plans to latch infant in morning once rested and pain is under control. Infant is currently using donor breast milk and was given 10 mls by support person using a slow flow bottle nipple  that was pace feed.  Parents will continue to feed infant according to hunger cues, on demand, 8 to 12+ times within 24 hrs, dad is doing STS with infant. Birth Parent made aware of O/P services, breastfeeding support groups, community resources, and our phone # for post-discharge questions.   Maternal Data Has patient been taught Hand Expression?: No Does the patient have breastfeeding experience prior to this delivery?: No  Feeding Mother's Current Feeding Choice: Breast Milk and Donor Milk  LATCH Score                    Lactation Tools Discussed/Used Tools: Shells;Pump Breast pump type: Double-Electric Breast Pump Pump Education: Setup, frequency, and cleaning;Milk Storage Reason for Pumping: Birth Parent in alot pain, will not latch infant at this time due trying manage pain, has alot of areola edema. Once pain is under  control Birth Parent will attempt to use DEBP on low setting for brest stimulation, try latch infant in moring. Pumping frequency: RN, review how to use DEBP low setting once pain mangement is under control, Birth Parent given breast shells to wear in bra due to severe areola edema.  Interventions Interventions: Skin to skin;DEBP;Education;Pace feeding;LC Services brochure;Breast feeding basics reviewed  Discharge Pump: Personal (Per Birth Parent  has DEBP at home.)  Consult Status Consult Status: Follow-up Date: 07/28/22 Follow-up type: In-patient    Danelle Earthly 07/27/2022, 8:31 PM

## 2022-07-27 NOTE — Anesthesia Postprocedure Evaluation (Signed)
Anesthesia Post Note  Patient: Michelle Davies  Procedure(s) Performed: AN AD HOC LABOR EPIDURAL     Patient location during evaluation: Mother Baby Anesthesia Type: Epidural Level of consciousness: awake and alert Pain management: pain level controlled Vital Signs Assessment: post-procedure vital signs reviewed and stable Respiratory status: spontaneous breathing, nonlabored ventilation and respiratory function stable Cardiovascular status: stable Postop Assessment: no headache, no backache and epidural receding Anesthetic complications: no   No notable events documented.  Last Vitals:  Vitals:   07/27/22 0930 07/27/22 1045  BP:  113/68  Pulse:    Resp: 18   Temp:    SpO2:      Last Pain:  Vitals:   07/27/22 1545  TempSrc:   PainSc: 0-No pain   Pain Goal:                   [name]

## 2022-07-27 NOTE — Lactation Note (Signed)
This note was copied from a baby's chart. Lactation Consultation Note  Patient Name: Michelle Davies Today's Date: 07/27/2022   Age:40 hours LC entered the room, Birth parent had visitors at this time, she will call when ready for [facility] services.  Maternal Data    Feeding    LATCH Score                    Lactation Tools Discussed/Used    Interventions    Discharge    Consult Status      Danelle Earthly 07/27/2022, 5:42 PM

## 2022-07-27 NOTE — Lactation Note (Signed)
This note was copied from a baby's chart. Lactation Consultation Note  Patient Name: Michelle Davies Today's Date: 07/27/2022 Reason for consult: L&D Initial assessment;Primapara;1st time breastfeeding;Term;Breastfeeding assistance (LC L/D visit at 40 mins PP, baby STS on the birthing parents chest, calm, alert  rooting. LC offered to assist to latch, mom receptive. Due to areola edema, LC used the reverse pressure exercise,after several attempts latched for 8-10 sucks.) Age: 59 mins PP  LC reassured mom baby made a good effort to latch, and when she is on MBU there are breast feeding tools to help counteract the areola edema.  Mom aware there will be F/U by [facility] today Latch score 4  Maternal Data Does the patient have breastfeeding experience prior to this delivery?: No  Feeding Mother's Current Feeding Choice: Breast Milk  LATCH Score Latch: Repeated attempts needed to sustain latch, nipple held in mouth throughout feeding, stimulation needed to elicit sucking reflex.  Audible Swallowing: None  Type of Nipple: Flat  Comfort (Breast/Nipple): Soft / non-tender  Hold (Positioning): Full assist, staff holds infant at breast  LATCH Score: 4   Lactation Tools Discussed/Used    Interventions Interventions: Breast feeding basics reviewed;Assisted with latch;Skin to skin;Reverse pressure;Breast compression;Adjust position;Education  Discharge    Consult Status Consult Status: Follow-up from L&D Date: 07/27/22 Follow-up type: In-patient    [name] 07/27/2022, 8:04 AM

## 2022-07-27 NOTE — Progress Notes (Signed)
Patient now complete/+1 with desire to push. Begin pushing at this time. Cat 1 tracing with early decels, TOCO q31m, pitocina t 52mU/min, MVUs 220  BP 104/73   Pulse 91   Temp 99 F (37.2 C) (Oral)   Resp 16   Ht 5\' 6"  (1.676 m)   Wt 106.6 kg   SpO2 98%   BMI 37.93 kg/m

## 2022-07-27 NOTE — Progress Notes (Signed)
Comfortable s/p epidural BP 109/62   Pulse 73   Temp 98.3 F (36.8 C) (Oral)   Resp 16   Ht 5\' 6"  (1.676 m)   Wt 106.6 kg   SpO2 98%   BMI 37.93 kg/m  Forebag on exam. Clear AROM @ 0054. Ce 3-4/90/-2 IUPC placed without issue given pitocin at 20 mU/min and some difficulty tracing ctx while left lateral.   Anticipate transition to active labor soon given forebag AROM and pitocin titration. Cat 1 tracing, TOCO q85m

## 2022-07-28 ENCOUNTER — Inpatient Hospital Stay (HOSPITAL_COMMUNITY): Admission: AD | Admit: 2022-07-28 | Payer: No Typology Code available for payment source | Source: Home / Self Care

## 2022-07-28 ENCOUNTER — Inpatient Hospital Stay (HOSPITAL_COMMUNITY): Admission: RE | Admit: 2022-07-28 | Payer: No Typology Code available for payment source | Source: Ambulatory Visit

## 2022-07-28 LAB — CBC
HCT: 27 % — ABNORMAL LOW (ref 36.0–46.0)
Hemoglobin: 9.1 g/dL — ABNORMAL LOW (ref 12.0–15.0)
MCH: 32.4 pg (ref 26.0–34.0)
MCHC: 33.7 g/dL (ref 30.0–36.0)
MCV: 96.1 fL (ref 80.0–100.0)
Platelets: 204 10*3/uL (ref 150–400)
RBC: 2.81 MIL/uL — ABNORMAL LOW (ref 3.87–5.11)
RDW: 14.2 % (ref 11.5–15.5)
WBC: 16 10*3/uL — ABNORMAL HIGH (ref 4.0–10.5)
nRBC: 0 % (ref 0.0–0.2)

## 2022-07-28 NOTE — Lactation Note (Signed)
This note was copied from a baby's chart. Lactation Consultation Note  Patient Name: Michelle Davies [id number] Date: 07/28/2022 Reason for consult: Follow-up assessment;1st time breastfeeding;Mother's request Age:40 hours  P1, Reviewed hand expression with mother.  Baby has not been able to sustain latch and has been supplemented with donor milk and now formula due to shortage of DM. Assisted with latching and baby was able to sustain latch for 15 min with 5 french feeding tube and formula. Mother has sensitive nipples and is cramping with latch but was able to tolerate feeding for 15 min. Encouraged mother to pump in approx 3 hours and will try SNS on other breast with next feeding. Suggest calling for help as needed.  Maternal Data Has patient been taught Hand Expression?: Yes Does the patient have breastfeeding experience prior to this delivery?: No  Feeding Mother's Current Feeding Choice: Breast Milk and Donor Milk  LATCH Score Latch: Grasps breast easily, tongue down, lips flanged, rhythmical sucking.  Audible Swallowing: A few with stimulation  Type of Nipple: Flat  Comfort (Breast/Nipple): Soft / non-tender  Hold (Positioning): Assistance needed to correctly position infant at breast and maintain latch.  LATCH Score: 7   Lactation Tools Discussed/Used Tools: Pump;88F feeding tube / Syringe Breast pump type: Double-Electric Breast Pump Reason for Pumping: stimulation and supplementation  Interventions Interventions: Breast feeding basics reviewed;Assisted with latch;Skin to skin;Hand express;DEBP;Education  Discharge Pump: Personal;DEBP  Consult Status Consult Status: Follow-up Date: 07/29/22 Follow-up type: In-patient    Dahlia Byes [facility] 07/28/2022, 10:43 AM

## 2022-07-28 NOTE — Progress Notes (Signed)
PPD #1 No problems Afeb, VSS Fundus firm, NT at U-1 Continue routine postpartum care 

## 2022-07-29 MED ORDER — IBUPROFEN 600 MG PO TABS: 600.0000 mg | ORAL_TABLET | Freq: Four times a day (QID) | ORAL | 0 refills | Status: AC | PRN

## 2022-07-29 NOTE — Discharge Summary (Signed)
Postpartum Discharge Summary       Patient Name: Michelle Davies DOB: 1981/02/23 MRN: [id number]  Date of admission: 07/26/2022 Delivery date:07/27/2022  Delivering provider: Carlisle Cater  Date of discharge: 07/29/2022  Admitting diagnosis: Normal labor and delivery [O80] Intrauterine pregnancy: [redacted]w[redacted]d     Secondary diagnosis:  Principal Problem:   Normal labor and delivery  Additional problems: Advanced maternal age    Discharge diagnosis: Term Pregnancy Delivered                                              Post partum procedures: NA Augmentation: AROM and Pitocin Complications: None  Hospital course: Onset of Labor With Vaginal Delivery      41 y.o. yo [id number] at [redacted]w[redacted]d was admitted in Latent Labor on 07/26/2022. Patient had an uncomplicated labor course as follows:  Membrane Rupture Time/Date: 10:00 AM ,07/26/2022   Delivery Method:Vaginal, Spontaneous  Episiotomy: None  Lacerations:  2nd degree;Periurethral  Patient had an uncomplicated postpartum course.  She is ambulating, tolerating a regular diet, passing flatus, and urinating well. Patient is discharged home in stable condition on 07/29/22.  Newborn Data: Birth date:07/27/2022  Birth time:7:02 AM  Gender:Female  Living status:Living  Apgars:8 ,9  Weight:3714 g   Magnesium Sulfate received: No BMZ received: No Rhophylac:N/A Physical exam  Vitals:   07/28/22 0538 07/28/22 1520 07/28/22 1921 07/29/22 0521  BP: 126/74 122/77 110/65 102/65  Pulse: 89 88 95 92  Resp: 18 18 16 18   Temp: 97.9 F (36.6 C) 98.1 F (36.7 C) 98.4 F (36.9 C) 98 F (36.7 C)  TempSrc: Oral Oral Oral Oral  SpO2:      Weight:      Height:       General: alert, cooperative, and no distress Lochia: appropriate Uterine Fundus: firm DVT Evaluation: No evidence of DVT seen on physical exam. Labs: Lab Results  Component Value Date   WBC 16.0 (H) 07/28/2022   HGB 9.1 (L) 07/28/2022   HCT 27.0 (L) 07/28/2022   MCV 96.1  07/28/2022   PLT 204 07/28/2022       No data to display         Edinburgh Score:    07/27/2022    9:30 AM  Edinburgh Postnatal Depression Scale Screening Tool  I have been able to laugh and see the funny side of things. 0  I have looked forward with enjoyment to things. 1  I have blamed myself unnecessarily when things went wrong. 1  I have been anxious or worried for no good reason. 1  I have felt scared or panicky for no good reason. 1  Things have been getting on top of me. 1  I have been so unhappy that I have had difficulty sleeping. 0  I have felt sad or miserable. 0  I have been so unhappy that I have been crying. 0  The thought of harming myself has occurred to me. 0  Edinburgh Postnatal Depression Scale Total 5      After visit meds:  Allergies as of 07/29/2022   No Known Allergies      Medication List     STOP taking these medications    aspirin EC 81 MG tablet   calcium carbonate 500 MG chewable tablet Commonly known as: TUMS - dosed in mg elemental calcium   VITAMIN D-3  PO       TAKE these medications    ibuprofen 600 MG tablet Commonly known as: ADVIL Take 1 tablet (600 mg total) by mouth every 6 (six) hours as needed.   PRENATAL PO Take 1 tablet by mouth daily.               Discharge Care Instructions  (From admission, onward)           Start     Ordered   07/29/22 0000  Discharge wound care:       Comments: For a cesarean delivery: You may wash incision with soap and water.  Do not soak or submerge the incision for 2 weeks. Keep incision dry. You may need to keep a sanitary pad or panty liner between the incision and your clothing for comfort and to keep the incision dry. If you note drainage, increased pain, or increased redness of the incision, then please notify your physician.   07/29/22 1111   07/29/22 0000  If the dressing is still on your incision site when you go home, remove it on the third day after your surgery  date. Remove dressing if it begins to fall off, or if it is dirty or damaged before the third day.       Comments: For a cesarean delivery   07/29/22 1111             Discharge home in stable condition Infant Feeding: Bottle and Breast Infant Disposition:home with mother Discharge instruction: per After Visit Summary and Postpartum booklet. Activity: Advance as tolerated. Pelvic rest for 6 weeks.  Diet: routine diet Anticipated Birth Control: Unsure Postpartum Appointment:4 weeks Future Appointments:No future appointments. Follow up Visit:  Follow-up Information     Shivaji, Valerie Roys, MD. Schedule an appointment as soon as possible for a visit in 4 week(s).   Specialty: Obstetrics and Gynecology Why: For a postpartum evaluation Contact information: [address] [place] [address] [place] [address] [phone]                     07/29/2022 Waynard Reeds, MD

## 2022-08-04 ENCOUNTER — Telehealth (HOSPITAL_COMMUNITY): Payer: Self-pay | Admitting: *Deleted

## 2022-08-04 NOTE — Telephone Encounter (Signed)
Left phone voicemail message.  Duffy Rhody, RN 08-04-2022 at 2:03pm

## 2023-03-17 ENCOUNTER — Other Ambulatory Visit: Payer: Self-pay | Admitting: Obstetrics and Gynecology

## 2023-03-17 DIAGNOSIS — R928 Other abnormal and inconclusive findings on diagnostic imaging of breast: Secondary | ICD-10-CM

## 2023-03-31 ENCOUNTER — Ambulatory Visit
Admission: RE | Admit: 2023-03-31 | Discharge: 2023-03-31 | Disposition: A | Discharge status: Home / Self Care | Payer: No Typology Code available for payment source | Source: Ambulatory Visit | Attending: Obstetrics and Gynecology | Admitting: Obstetrics and Gynecology

## 2023-03-31 ENCOUNTER — Other Ambulatory Visit: Payer: Self-pay | Admitting: Obstetrics and Gynecology

## 2023-03-31 DIAGNOSIS — R928 Other abnormal and inconclusive findings on diagnostic imaging of breast: Secondary | ICD-10-CM

## 2023-03-31 DIAGNOSIS — N6320 Unspecified lump in the left breast, unspecified quadrant: Secondary | ICD-10-CM

## 2023-04-05 ENCOUNTER — Ambulatory Visit
Admission: RE | Admit: 2023-04-05 | Discharge: 2023-04-05 | Disposition: A | Discharge status: Home / Self Care | Payer: No Typology Code available for payment source | Source: Ambulatory Visit | Attending: Obstetrics and Gynecology | Admitting: Obstetrics and Gynecology

## 2023-04-05 DIAGNOSIS — N6320 Unspecified lump in the left breast, unspecified quadrant: Secondary | ICD-10-CM

## 2023-04-05 HISTORY — PX: BREAST BIOPSY: SHX20

## 2024-05-11 DIAGNOSIS — K120 Recurrent oral aphthae: Secondary | ICD-10-CM | POA: Diagnosis not present

## 2024-05-11 DIAGNOSIS — J208 Acute bronchitis due to other specified organisms: Secondary | ICD-10-CM | POA: Diagnosis not present

## 2024-05-11 DIAGNOSIS — F17210 Nicotine dependence, cigarettes, uncomplicated: Secondary | ICD-10-CM | POA: Diagnosis not present

## 2024-05-11 DIAGNOSIS — R051 Acute cough: Secondary | ICD-10-CM | POA: Diagnosis not present

## 2024-05-15 DIAGNOSIS — Z79899 Other long term (current) drug therapy: Secondary | ICD-10-CM | POA: Diagnosis not present

## 2024-05-26 DIAGNOSIS — R0981 Nasal congestion: Secondary | ICD-10-CM | POA: Diagnosis not present

## 2024-05-26 DIAGNOSIS — H9201 Otalgia, right ear: Secondary | ICD-10-CM | POA: Diagnosis not present

## 2024-05-26 DIAGNOSIS — J208 Acute bronchitis due to other specified organisms: Secondary | ICD-10-CM | POA: Diagnosis not present

## 2024-05-26 DIAGNOSIS — R051 Acute cough: Secondary | ICD-10-CM | POA: Diagnosis not present

## 2024-05-26 DIAGNOSIS — B9689 Other specified bacterial agents as the cause of diseases classified elsewhere: Secondary | ICD-10-CM | POA: Diagnosis not present

## 2024-06-18 DIAGNOSIS — H65191 Other acute nonsuppurative otitis media, right ear: Secondary | ICD-10-CM | POA: Diagnosis not present

## 2024-06-18 DIAGNOSIS — H9201 Otalgia, right ear: Secondary | ICD-10-CM | POA: Diagnosis not present

## 2024-07-18 DIAGNOSIS — L638 Other alopecia areata: Secondary | ICD-10-CM | POA: Diagnosis not present

## 2024-07-18 DIAGNOSIS — L820 Inflamed seborrheic keratosis: Secondary | ICD-10-CM | POA: Diagnosis not present
